# Patient Record
Sex: Female | Born: 1950
Health system: Southern US, Community
[De-identification: ages and names within clinical notes are randomized; demographics above are authoritative.]

## PROBLEM LIST (undated history)

## (undated) DIAGNOSIS — M1990 Unspecified osteoarthritis, unspecified site: Secondary | ICD-10-CM

## (undated) DIAGNOSIS — E785 Hyperlipidemia, unspecified: Secondary | ICD-10-CM

## (undated) DIAGNOSIS — T7840XA Allergy, unspecified, initial encounter: Secondary | ICD-10-CM

## (undated) HISTORY — DX: Unspecified osteoarthritis, unspecified site: M19.90

## (undated) HISTORY — DX: Hyperlipidemia, unspecified: E78.5

## (undated) HISTORY — DX: Allergy, unspecified, initial encounter: T78.40XA

---

## 1984-04-11 HISTORY — PX: TUBAL LIGATION: SHX77

## 2012-07-20 ENCOUNTER — Ambulatory Visit (INDEPENDENT_AMBULATORY_CARE_PROVIDER_SITE_OTHER): Payer: 59 | Admitting: Family Medicine

## 2012-07-20 ENCOUNTER — Encounter: Payer: Self-pay | Admitting: Family Medicine

## 2012-07-20 VITALS — BP 110/70 | HR 72 | Temp 98.5°F | Resp 14 | Wt 122.0 lb

## 2012-07-20 DIAGNOSIS — L821 Other seborrheic keratosis: Secondary | ICD-10-CM

## 2012-07-20 DIAGNOSIS — B353 Tinea pedis: Secondary | ICD-10-CM

## 2012-07-20 DIAGNOSIS — E785 Hyperlipidemia, unspecified: Secondary | ICD-10-CM | POA: Insufficient documentation

## 2012-07-20 NOTE — Progress Notes (Signed)
  Subjective:    Patient ID: Taylor Shah, female    DOB: 05-19-1950, 62 y.o.   MRN: [id number]  HPI  Patient is here for 2 rashes. She has a salmon colored to brown scaly papule on her left breast that has been there for several months. It is asymptomatic otherwise.  He also has a red scaly patch on her right second toe which worsened with cortisone cream has been there for over a month.  He has a clinical characteristics of athlete's foot. It is also next to 80 great toe that has onychomycosis.  Past Medical History  Diagnosis Date  . Hyperlipidemia     Current outpatient prescriptions:Nutritional Supplements (JUICE PLUS FIBRE) LIQD, Take by mouth., Disp: , Rfl: ;  vitamin C (ASCORBIC ACID) 500 MG tablet, Take 500 mg by mouth daily., Disp: , Rfl:   History   Social History  . Marital Status: Married    Spouse Name: N/A    Number of Children: N/A  . Years of Education: N/A   Occupational History  . Not on file.   Social History Main Topics  . Smoking status: Never Smoker   . Smokeless tobacco: Not on file  . Alcohol Use: Yes     Comment: Wine - occasional  . Drug Use: No  . Sexually Active: Not on file   Other Topics Concern  . Not on file   Social History Narrative  . No narrative on file     Review of Systems    review of systems is otherwise negative. Objective:   Physical Exam  Constitutional: She appears well-developed and well-nourished.  Cardiovascular: Normal rate, regular rhythm and normal heart sounds.   No murmur heard. Pulmonary/Chest: Effort normal and breath sounds normal.   papule on left breast is approximately 6 mm, patch on right second toe is approximately 1.5 cm in diameter.          Assessment & Plan:  Seborrheic keratoses  Tenia pedis   Reassured patient regarding spot on her left breast we will monitor clinically.  Regimen cream twice a day for 2 weeks to the spot on her foot. If it is not better she will contact me.

## 2012-08-10 ENCOUNTER — Telehealth: Payer: Self-pay | Admitting: Family Medicine

## 2012-08-10 DIAGNOSIS — Z0000 Encounter for general adult medical examination without abnormal findings: Secondary | ICD-10-CM

## 2012-08-13 NOTE — Telephone Encounter (Signed)
Labs ordered for CPE

## 2012-09-05 ENCOUNTER — Other Ambulatory Visit (INDEPENDENT_AMBULATORY_CARE_PROVIDER_SITE_OTHER): Payer: 59

## 2012-09-05 DIAGNOSIS — Z0000 Encounter for general adult medical examination without abnormal findings: Secondary | ICD-10-CM

## 2012-09-06 ENCOUNTER — Other Ambulatory Visit: Payer: 59

## 2012-09-06 LAB — CBC WITH DIFFERENTIAL/PLATELET
Basophils Absolute: 0 10*3/uL (ref 0.0–0.1)
Basophils Relative: 0 % (ref 0–1)
Eosinophils Absolute: 0.1 10*3/uL (ref 0.0–0.7)
MCH: 27.9 pg (ref 26.0–34.0)
MCHC: 32.8 g/dL (ref 30.0–36.0)
Monocytes Relative: 8 % (ref 3–12)
Neutrophils Relative %: 53 % (ref 43–77)
Platelets: 228 10*3/uL (ref 150–400)
RDW: 13.9 % (ref 11.5–15.5)

## 2012-09-06 LAB — COMPREHENSIVE METABOLIC PANEL
Alkaline Phosphatase: 69 U/L (ref 39–117)
Glucose, Bld: 81 mg/dL (ref 70–99)
Sodium: 138 mEq/L (ref 135–145)
Total Bilirubin: 0.8 mg/dL (ref 0.3–1.2)
Total Protein: 6.8 g/dL (ref 6.0–8.3)

## 2012-09-06 LAB — LIPID PANEL
LDL Cholesterol: 193 mg/dL — ABNORMAL HIGH (ref 0–99)
Triglycerides: 56 mg/dL (ref <150)
VLDL: 11 mg/dL (ref 0–40)

## 2012-09-13 ENCOUNTER — Ambulatory Visit (INDEPENDENT_AMBULATORY_CARE_PROVIDER_SITE_OTHER): Payer: 59 | Admitting: Family Medicine

## 2012-09-13 ENCOUNTER — Encounter: Payer: Self-pay | Admitting: Family Medicine

## 2012-09-13 VITALS — BP 110/68 | HR 78 | Temp 98.2°F | Resp 20 | Ht 62.0 in | Wt 115.0 lb

## 2012-09-13 DIAGNOSIS — Z1239 Encounter for other screening for malignant neoplasm of breast: Secondary | ICD-10-CM

## 2012-09-13 DIAGNOSIS — Z1211 Encounter for screening for malignant neoplasm of colon: Secondary | ICD-10-CM

## 2012-09-13 DIAGNOSIS — Z0000 Encounter for general adult medical examination without abnormal findings: Secondary | ICD-10-CM

## 2012-09-13 DIAGNOSIS — E785 Hyperlipidemia, unspecified: Secondary | ICD-10-CM

## 2012-09-13 NOTE — Progress Notes (Signed)
Subjective:    Patient ID: Taylor Shah, female    DOB: 1951-02-04, 62 y.o.   MRN: [id number]  HPI Patient is here today for complete physical exam. Her recent lab work was reviewed in detail with the patient. Is significant for an LDL cholesterol of 193. She has a significant family history of coronary artery disease with bypasses in her father and a stroke in her paternal grandmother.  The labs are listed below. No visits with results within 1 Week(s) from this visit. Latest known visit with results is:  Appointment on 09/05/2012  Component Date Value Range Status  . WBC 09/05/2012 6.1  4.0 - 10.5 K/uL Final  . RBC 09/05/2012 4.37  3.87 - 5.11 MIL/uL Final  . Hemoglobin 09/05/2012 12.2  12.0 - 15.0 g/dL Final  . HCT 40/98/1191 37.2  36.0 - 46.0 % Final  . MCV 09/05/2012 85.1  78.0 - 100.0 fL Final  . MCH 09/05/2012 27.9  26.0 - 34.0 pg Final  . MCHC 09/05/2012 32.8  30.0 - 36.0 g/dL Final  . RDW 47/82/9562 13.9  11.5 - 15.5 % Final  . Platelets 09/05/2012 228  150 - 400 K/uL Final  . Neutrophils Relative % 09/05/2012 53  43 - 77 % Final  . Neutro Abs 09/05/2012 3.2  1.7 - 7.7 K/uL Final  . Lymphocytes Relative 09/05/2012 38  12 - 46 % Final  . Lymphs Abs 09/05/2012 2.3  0.7 - 4.0 K/uL Final  . Monocytes Relative 09/05/2012 8  3 - 12 % Final  . Monocytes Absolute 09/05/2012 0.5  0.1 - 1.0 K/uL Final  . Eosinophils Relative 09/05/2012 1  0 - 5 % Final  . Eosinophils Absolute 09/05/2012 0.1  0.0 - 0.7 K/uL Final  . Basophils Relative 09/05/2012 0  0 - 1 % Final  . Basophils Absolute 09/05/2012 0.0  0.0 - 0.1 K/uL Final  . Smear Review 09/05/2012 Criteria for review not met   Final  . Sodium 09/05/2012 138  135 - 145 mEq/L Final  . Potassium 09/05/2012 4.0  3.5 - 5.3 mEq/L Final  . Chloride 09/05/2012 101  96 - 112 mEq/L Final  . CO2 09/05/2012 26  19 - 32 mEq/L Final  . Glucose, Bld 09/05/2012 81  70 - 99 mg/dL Final  . BUN 13/11/6576 11  6 - 23 mg/dL Final  . Creat 46/96/2952 0.70   0.50 - 1.10 mg/dL Final  . Total Bilirubin 09/05/2012 0.8  0.3 - 1.2 mg/dL Final  . Alkaline Phosphatase 09/05/2012 69  39 - 117 U/L Final  . AST 09/05/2012 17  0 - 37 U/L Final  . ALT 09/05/2012 12  0 - 35 U/L Final  . Total Protein 09/05/2012 6.8  6.0 - 8.3 g/dL Final  . Albumin 84/13/2440 4.4  3.5 - 5.2 g/dL Final  . Calcium 02/05/2535 9.2  8.4 - 10.5 mg/dL Final  . Cholesterol 64/40/3474 264* 0 - 200 mg/dL Final   Comment: ATP III Classification:                                < 200        mg/dL        Desirable                               200 - 239     mg/dL  Borderline High                               >= 240        mg/dL        High                             . Triglycerides 09/05/2012 56  <150 mg/dL Final  . HDL 09/81/1914 60  >39 mg/dL Final  . Total CHOL/HDL Ratio 09/05/2012 4.4   Final  . VLDL 09/05/2012 11  0 - 40 mg/dL Final  . LDL Cholesterol 09/05/2012 193* 0 - 99 mg/dL Final   Comment:                            Total Cholesterol/HDL Ratio:CHD Risk                                                 Coronary Heart Disease Risk Table                                                                 Men       Women                                   1/2 Average Risk              3.4        3.3                                       Average Risk              5.0        4.4                                    2X Average Risk              9.6        7.1                                    3X Average Risk             23.4       11.0                          Use the calculated Patient Ratio above and the CHD Risk table                           to determine the patient's CHD Risk.  ATP III Classification (LDL):                                < 100        mg/dL         Optimal                               100 - 129     mg/dL         Near or Above Optimal                               130 - 159     mg/dL         Borderline High                                160 - 189     mg/dL         High                                > 190        mg/dL         Very High                             . TSH 09/05/2012 0.569  0.350 - 4.500 uIU/mL Final   She has never had a colonoscopy. She is overdue for her mammogram. She is overdue for her Pap smear which she defers at the present time. She'll be due for a bone density a 63. She is overdue for her tetanus shot. She is also due for a single shot. Past Medical History  Diagnosis Date  . Hyperlipidemia    Outpatient Encounter Prescriptions as of 09/13/2012  Medication Sig Dispense Refill  . Nutritional Supplements (JUICE PLUS FIBRE) LIQD Take by mouth.      . psyllium (METAMUCIL) 58.6 % powder Take 1 packet by mouth 3 (three) times daily.      . vitamin C (ASCORBIC ACID) 500 MG tablet Take 500 mg by mouth daily.       No facility-administered encounter medications on file as of 09/13/2012.   No Known Allergies History   Social History  . Marital Status: Married    Spouse Name: N/A    Number of Children: N/A  . Years of Education: N/A   Occupational History  . Not on file.   Social History Main Topics  . Smoking status: Never Smoker   . Smokeless tobacco: Never Used  . Alcohol Use: Yes     Comment: Wine - occasional  . Drug Use: No  . 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   Other Topics Concern  . Not on file   Social History Narrative  . No narrative on file   Family History  Problem Relation Age of Onset  . Heart disease Father   . Hyperlipidemia Brother   . Stroke Paternal Grandmother       Review of Systems  All other systems reviewed and are negative.       Objective:   Physical Exam  Vitals reviewed. Constitutional: She is oriented to person, place, and time. She appears well-developed and well-nourished.  HENT:  Head: Normocephalic and atraumatic.  Right Ear: External ear normal.  Left Ear: External ear normal.  Nose: Nose  normal.  Mouth/Throat: No oropharyngeal exudate.  Eyes: Conjunctivae and EOM are normal. Pupils are equal, round, and reactive to light. Right eye exhibits no discharge. Left eye exhibits no discharge. No scleral icterus.  Neck: Normal range of motion. Neck supple. No JVD present. No tracheal deviation present. No thyromegaly present.  Cardiovascular: Normal rate, regular rhythm, normal heart sounds and intact distal pulses.  Exam reveals no gallop and no friction rub.   No murmur heard. Pulmonary/Chest: Effort normal and breath sounds normal. No respiratory distress. She has no wheezes. She has no rales. She exhibits no tenderness.  Abdominal: Soft. Bowel sounds are normal. She exhibits no distension and no mass. There is no tenderness. There is no rebound and no guarding.  Musculoskeletal: Normal range of motion. She exhibits no edema and no tenderness.  Lymphadenopathy:    She has no cervical adenopathy.  Neurological: She is alert and oriented to person, place, and time. She has normal reflexes. She displays normal reflexes. No cranial nerve deficit. She exhibits normal muscle tone. Coordination normal.  Skin: Skin is warm. No rash noted. No erythema. No pallor.  Psychiatric: She has a normal mood and affect. Her behavior is normal. Judgment and thought content normal.          Assessment & Plan:  1. Routine general medical examination at a health care facility Physical exam is normal. I will arrange for colonoscopy and a mammogram. I strongly recommended a Pap smear. I also recommended a single shot and a tetanus shot. The patient is to call single shot and tetanus shot and return if she wants.  Otherwise physical exam is normal aside from her hyperlipidemia. - Ambulatory referral to Gastroenterology - MM Digital Screening; Future  2. HLD (hyperlipidemia) Charlie recommended Lipitor 40 mg by mouth daily. Patient deferred for now.  3. Screening for breast cancer Schedule  mammogram - MM Digital Screening; Future  4. Screening for colon cancer Surgical colonoscopy - Ambulatory referral to Gastroenterology

## 2012-09-21 ENCOUNTER — Encounter: Payer: Self-pay | Admitting: Internal Medicine

## 2012-10-09 ENCOUNTER — Telehealth: Payer: Self-pay | Admitting: Family Medicine

## 2012-10-09 MED ORDER — ATORVASTATIN CALCIUM 40 MG PO TABS: 40.0000 mg | ORAL_TABLET | Freq: Every day | ORAL | Status: DC

## 2012-10-09 NOTE — Telephone Encounter (Signed)
Rx filled

## 2012-10-15 ENCOUNTER — Ambulatory Visit
Admission: RE | Admit: 2012-10-15 | Discharge: 2012-10-15 | Disposition: A | Discharge status: Home / Self Care | Payer: 59 | Source: Ambulatory Visit | Attending: Family Medicine | Admitting: Family Medicine

## 2012-10-15 DIAGNOSIS — Z0000 Encounter for general adult medical examination without abnormal findings: Secondary | ICD-10-CM

## 2012-10-15 DIAGNOSIS — Z1239 Encounter for other screening for malignant neoplasm of breast: Secondary | ICD-10-CM

## 2012-11-05 ENCOUNTER — Encounter: Payer: Self-pay | Admitting: Family Medicine

## 2012-11-05 ENCOUNTER — Ambulatory Visit (INDEPENDENT_AMBULATORY_CARE_PROVIDER_SITE_OTHER): Payer: 59 | Admitting: Family Medicine

## 2012-11-05 VITALS — BP 98/62 | HR 74 | Temp 98.2°F | Resp 14 | Wt 117.0 lb

## 2012-11-05 DIAGNOSIS — B353 Tinea pedis: Secondary | ICD-10-CM

## 2012-11-05 MED ORDER — FLUCONAZOLE 150 MG PO TABS: ORAL_TABLET | ORAL | Status: DC

## 2012-11-05 MED ORDER — CLOTRIMAZOLE 1 % EX CREA: TOPICAL_CREAM | Freq: Two times a day (BID) | CUTANEOUS | Status: DC

## 2012-11-05 NOTE — Progress Notes (Signed)
  Subjective:    Patient ID: Taylor Shah, female    DOB: [birth year], 62 y.o.   MRN: [id number]  HPI  Patient has had a scaly patch on the plantar aspect of her left foot for her over a month. She is tried over-the-counter Lotrimin cream, Tinactin, and even soaking her feet in peroxide.  This has not helped. It is a patch approximately 8 cm x 6 cm in diameter. It is erythematous and scaly. It is well-circumscribed. KOH performed today in the office his negative. Past Medical History  Diagnosis Date  . Hyperlipidemia    Current Outpatient Prescriptions on File Prior to Visit  Medication Sig Dispense Refill  . atorvastatin (LIPITOR) 40 MG tablet Take 1 tablet (40 mg total) by mouth at bedtime.  30 tablet  3  . Nutritional Supplements (JUICE PLUS FIBRE) LIQD Take by mouth.      . psyllium (METAMUCIL) 58.6 % powder Take 1 packet by mouth 3 (three) times daily.      . vitamin C (ASCORBIC ACID) 500 MG tablet Take 500 mg by mouth daily.       No current facility-administered medications on file prior to visit.   No Known Allergies History   Social History  . Marital Status: Married    Spouse Name: N/A    Number of Children: N/A  . Years of Education: N/A   Occupational History  . Not on file.   Social History Main Topics  . Smoking status: Never Smoker   . Smokeless tobacco: Never Used  . Alcohol Use: Yes     Comment: Wine - occasional  . Drug Use: No  . 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   Other Topics Concern  . Not on file   Social History Narrative  . No narrative on file      Review of Systems  All other systems reviewed and are negative.       Objective:   Physical Exam  Vitals reviewed. Constitutional: She appears well-developed and well-nourished.  Cardiovascular: Normal rate and regular rhythm.   Pulmonary/Chest: Effort normal and breath sounds normal.  Abdominal: Soft. Bowel sounds are normal.  Skin: Rash noted.   8  cm x 6 cm well-circumscribed scaly erythematous patch on the plantar aspect of the left foot        Assessment & Plan:  1. Tinea pedis I believe the KOH is negative because the patient has tried treating it with topical therapy.  Clinically it appears to be tenia pedis. Therefore I recommended Diflucan 150 mg by mouth 4 times a day for 2 weeks followed by Lotrimin cream applied twice a day for an additional 2 weeks.  Recheck in 2 weeks if no better. Recommended the patient discontinue Lipitor while she is taking the Diflucan. - fluconazole (DIFLUCAN) 150 MG tablet; 1 pill every other day for 2 weeks  Dispense: 7 tablet; Refill: 0 - clotrimazole (LOTRIMIN) 1 % cream; Apply topically 2 (two) times daily.  Dispense: 30 g; Refill: 0

## 2012-11-07 ENCOUNTER — Ambulatory Visit (AMBULATORY_SURGERY_CENTER): Payer: 59 | Admitting: *Deleted

## 2012-11-07 VITALS — Ht 62.0 in | Wt 117.2 lb

## 2012-11-07 DIAGNOSIS — Z1211 Encounter for screening for malignant neoplasm of colon: Secondary | ICD-10-CM

## 2012-11-07 MED ORDER — NA SULFATE-K SULFATE-MG SULF 17.5-3.13-1.6 GM/177ML PO SOLN: 1.0000 | Freq: Once | ORAL | Status: DC

## 2012-11-07 NOTE — Progress Notes (Signed)
No egg or soy allergy. No anesthesia problems.  

## 2012-11-08 ENCOUNTER — Encounter: Payer: Self-pay | Admitting: Internal Medicine

## 2012-11-22 ENCOUNTER — Encounter: Payer: Self-pay | Admitting: Internal Medicine

## 2012-11-22 ENCOUNTER — Ambulatory Visit (AMBULATORY_SURGERY_CENTER): Payer: 59 | Admitting: Internal Medicine

## 2012-11-22 VITALS — BP 125/55 | HR 69 | Temp 98.4°F | Resp 16 | Ht 62.0 in | Wt 117.0 lb

## 2012-11-22 DIAGNOSIS — Z1211 Encounter for screening for malignant neoplasm of colon: Secondary | ICD-10-CM

## 2012-11-22 MED ORDER — SODIUM CHLORIDE 0.9 % IV SOLN: 500.0000 mL | INTRAVENOUS | Status: DC

## 2012-11-22 NOTE — Progress Notes (Signed)
A/ox3 pleased with MAC, report to Karen RN 

## 2012-11-22 NOTE — Patient Instructions (Addendum)
The colonoscopy was normal with a great prep.  Next routine colonoscopy in 10 years 2024.  I appreciate the opportunity to care for you. Iva Boop, MD, FACG YOU HAD AN ENDOSCOPIC PROCEDURE TODAY AT THE [facility]: Refer to the procedure report that was given to you for any specific questions about what was found during the examination.  If the procedure report does not answer your questions, please call your gastroenterologist to clarify.  If you requested that your care partner not be given the details of your procedure findings, then the procedure report has been included in a sealed envelope for you to review at your convenience later.  YOU SHOULD EXPECT: Some feelings of bloating in the abdomen. Passage of more gas than usual.  Walking can help get rid of the air that was put into your GI tract during the procedure and reduce the bloating. If you had a lower endoscopy (such as a colonoscopy or flexible sigmoidoscopy) you may notice spotting of blood in your stool or on the toilet paper. If you underwent a bowel prep for your procedure, then you may not have a normal bowel movement for a few days.  DIET: Your first meal following the procedure should be a light meal and then it is ok to progress to your normal diet.  A half-sandwich or bowl of soup is an example of a good first meal.  Heavy or fried foods are harder to digest and may make you feel nauseous or bloated.  Likewise meals heavy in dairy and vegetables can cause extra gas to form and this can also increase the bloating.  Drink plenty of fluids but you should avoid alcoholic beverages for 24 hours.  ACTIVITY: Your care partner should take you home directly after the procedure.  You should plan to take it easy, moving slowly for the rest of the day.  You can resume normal activity the day after the procedure however you should NOT DRIVE or use heavy machinery for 24 hours (because of the sedation medicines used during the  test).    SYMPTOMS TO REPORT IMMEDIATELY: A gastroenterologist can be reached at any hour.  During normal business hours, 8:30 AM to 5:00 PM Monday through Friday, call [phone].  After hours and on weekends, please call the GI answering service at [phone] who will take a message and have the physician on call contact you.   Following lower endoscopy (colonoscopy or flexible sigmoidoscopy):  Excessive amounts of blood in the stool  Significant tenderness or worsening of abdominal pains  Swelling of the abdomen that is new, acute  Fever of 100F or higher  FOLLOW UP: If any biopsies were taken you will be contacted by phone or by letter within the next 1-3 weeks.  Call your gastroenterologist if you have not heard about the biopsies in 3 weeks.  Our staff will call the home number listed on your records the next business day following your procedure to check on you and address any questions or concerns that you may have at that time regarding the information given to you following your procedure. This is a courtesy call and so if there is no answer at the home number and we have not heard from you through the emergency physician on call, we will assume that you have returned to your regular daily activities without incident.  SIGNATURES/CONFIDENTIALITY: You and/or your care partner have signed paperwork which will be entered into your electronic medical record.  These signatures attest to the fact that that the information above on your After Visit Summary has been reviewed and is understood.  Full responsibility of the confidentiality of this discharge information lies with you and/or your care-partner.

## 2012-11-22 NOTE — Op Note (Signed)
[facility] [address] [place], [address]   COLONOSCOPY PROCEDURE REPORT  PATIENT: Taylor Shah, Taylor Shah  MR#: [id number] BIRTHDATE: 1950-05-06 , 62  yrs. old GENDER: Female ENDOSCOPIST: Iva Boop, MD, [facility] REFERRED HQ:[id number] Tanya Nones, M.D. PROCEDURE DATE:  11/22/2012 PROCEDURE:   Colonoscopy, screening First Screening Colonoscopy - Avg.  risk and is 50 yrs.  old or older Yes.  Prior Negative Screening - Now for repeat screening. N/A  History of Adenoma - Now for follow-up colonoscopy & has been > or = to 3 yrs.  N/A  Polyps Removed Today? No.  Recommend repeat exam, <10 yrs? No. ASA CLASS:   Class II INDICATIONS:average risk screening. MEDICATIONS: propofol (Diprivan) 200mg  IV, MAC sedation, administered by CRNA, and These medications were titrated to patient response per physician's verbal order  DESCRIPTION OF PROCEDURE:   After the risks benefits and alternatives of the procedure were thoroughly explained, informed consent was obtained.  A digital rectal exam revealed no abnormalities of the rectum.   The LB [id number]  endoscope was introduced through the anus and advanced to the cecum, which was identified by both the appendix and ileocecal valve. No adverse events experienced.   The quality of the prep was excellent using Suprep  The instrument was then slowly withdrawn as the colon was fully examined.      COLON FINDINGS: A normal appearing cecum, ileocecal valve, and appendiceal orifice were identified.  The ascending, hepatic flexure, transverse, splenic flexure, descending, sigmoid colon and rectum appeared unremarkable.  No polyps or cancers were seen.   A right colon retroflexion was performed.  Retroflexed views revealed no abnormalities. The time to cecum=4 minutes 03 seconds. Withdrawal time=9 minutes 11 seconds.  The scope was withdrawn and the procedure completed. COMPLICATIONS: There were no complications.  ENDOSCOPIC  IMPRESSION: Normal colonoscopy  RECOMMENDATIONS: Repeat routine colonscopy in 10 years.   eSigned:  Iva Boop, MD, [facility] 11/22/2012 10:37 AM   cc: Lynnea Ferrier, MD and The Patient

## 2012-11-22 NOTE — Progress Notes (Signed)
Patient did not experience any of the following events: a burn prior to discharge; a fall within the facility; wrong site/side/patient/procedure/implant event; or a hospital transfer or hospital admission upon discharge from the facility. (G8907) Patient did not have preoperative order for IV antibiotic SSI prophylaxis. (G8918)  

## 2012-11-23 ENCOUNTER — Telehealth: Payer: Self-pay

## 2012-11-23 NOTE — Telephone Encounter (Signed)
  Follow up Call-  Call back number 11/22/2012  Post procedure Call Back phone  # [phone]  Permission to leave phone message Yes     Patient questions:  Do you have a fever, pain , or abdominal swelling? no Pain Score  0 *  Have you tolerated food without any problems? yes  Have you been able to return to your normal activities? yes  Do you have any questions about your discharge instructions: Diet   no Medications  no Follow up visit  no  Do you have questions or concerns about your Care? no  Actions: * If pain score is 4 or above: No action needed, pain <4.

## 2013-02-14 ENCOUNTER — Other Ambulatory Visit: Payer: Self-pay

## 2013-07-25 ENCOUNTER — Telehealth: Payer: Self-pay | Admitting: Family Medicine

## 2013-07-25 MED ORDER — NITROFURANTOIN MACROCRYSTAL 50 MG PO CAPS: 50.0000 mg | ORAL_CAPSULE | ORAL | Status: DC | PRN

## 2013-07-25 NOTE — Telephone Encounter (Signed)
Pt is wanting a refill on Macrobid 50mg  to take after sexual activity, she states that you had given her an rx for this a while back and she only takes it occasionally to avoid UTI's. ? OK to Refill

## 2013-07-25 NOTE — Telephone Encounter (Signed)
Med sent to pharm / pt aware   

## 2013-07-25 NOTE — Telephone Encounter (Signed)
Ok with 10 tabs.

## 2013-09-30 ENCOUNTER — Telehealth: Payer: Self-pay | Admitting: Family Medicine

## 2013-09-30 NOTE — Telephone Encounter (Signed)
On what medication?

## 2013-09-30 NOTE — Telephone Encounter (Signed)
Patient is calling to get refill on [phone] [facility]

## 2013-10-01 ENCOUNTER — Other Ambulatory Visit: Payer: Self-pay | Admitting: Family Medicine

## 2013-10-01 NOTE — Telephone Encounter (Signed)
Patient states she is using as prophylactic.   Ok to refill??  Last office visit 11/05/2012.  Last refill 07/25/2013.

## 2013-10-01 NOTE — Telephone Encounter (Signed)
ok 

## 2013-10-02 NOTE — Telephone Encounter (Signed)
Prescription sent to pharmacy.

## 2013-10-24 ENCOUNTER — Other Ambulatory Visit: Payer: 59

## 2013-10-24 ENCOUNTER — Other Ambulatory Visit: Payer: Self-pay | Admitting: Family Medicine

## 2013-10-24 DIAGNOSIS — Z0000 Encounter for general adult medical examination without abnormal findings: Secondary | ICD-10-CM

## 2013-10-24 DIAGNOSIS — Z79899 Other long term (current) drug therapy: Secondary | ICD-10-CM

## 2013-10-24 DIAGNOSIS — E785 Hyperlipidemia, unspecified: Secondary | ICD-10-CM

## 2013-10-24 LAB — LIPID PANEL
CHOLESTEROL: 303 mg/dL — AB (ref 0–200)
HDL: 74 mg/dL (ref >39)
LDL Cholesterol: 218 mg/dL — ABNORMAL HIGH (ref 0–99)
TRIGLYCERIDES: 55 mg/dL (ref <150)
Total CHOL/HDL Ratio: 4.1 Ratio
VLDL: 11 mg/dL (ref 0–40)

## 2013-10-24 LAB — COMPLETE METABOLIC PANEL WITH GFR
ALBUMIN: 4.3 g/dL (ref 3.5–5.2)
ALK PHOS: 63 U/L (ref 39–117)
ALT: 15 U/L (ref 0–35)
AST: 18 U/L (ref 0–37)
BUN: 13 mg/dL (ref 6–23)
CO2: 28 meq/L (ref 19–32)
Calcium: 9.2 mg/dL (ref 8.4–10.5)
Chloride: 102 mEq/L (ref 96–112)
Creat: 0.69 mg/dL (ref 0.50–1.10)
GFR, Est Non African American: >89 mL/min
GLUCOSE: 76 mg/dL (ref 70–99)
POTASSIUM: 4.3 meq/L (ref 3.5–5.3)
SODIUM: 138 meq/L (ref 135–145)
TOTAL PROTEIN: 6.6 g/dL (ref 6.0–8.3)
Total Bilirubin: 0.7 mg/dL (ref 0.2–1.2)

## 2013-10-24 LAB — CBC WITH DIFFERENTIAL/PLATELET
BASOS PCT: 1 % (ref 0–1)
Basophils Absolute: 0.1 10*3/uL (ref 0.0–0.1)
EOS ABS: 0.1 10*3/uL (ref 0.0–0.7)
EOS PCT: 2 % (ref 0–5)
HCT: 37.5 % (ref 36.0–46.0)
HEMOGLOBIN: 12.9 g/dL (ref 12.0–15.0)
Lymphocytes Relative: 30 % (ref 12–46)
Lymphs Abs: 1.7 10*3/uL (ref 0.7–4.0)
MCH: 28.7 pg (ref 26.0–34.0)
MCHC: 34.4 g/dL (ref 30.0–36.0)
MCV: 83.5 fL (ref 78.0–100.0)
MONO ABS: 0.6 10*3/uL (ref 0.1–1.0)
MONOS PCT: 10 % (ref 3–12)
NEUTROS PCT: 57 % (ref 43–77)
Neutro Abs: 3.3 10*3/uL (ref 1.7–7.7)
Platelets: 223 10*3/uL (ref 150–400)
RBC: 4.49 MIL/uL (ref 3.87–5.11)
RDW: 14.1 % (ref 11.5–15.5)
WBC: 5.8 10*3/uL (ref 4.0–10.5)

## 2013-10-24 LAB — C-REACTIVE PROTEIN: CRP: <0.5 mg/dL (ref <0.60)

## 2013-10-24 LAB — TSH: TSH: 0.645 u[IU]/mL (ref 0.350–4.500)

## 2013-10-31 ENCOUNTER — Encounter: Payer: Self-pay | Admitting: Family Medicine

## 2013-10-31 ENCOUNTER — Ambulatory Visit (INDEPENDENT_AMBULATORY_CARE_PROVIDER_SITE_OTHER): Payer: 59 | Admitting: Family Medicine

## 2013-10-31 VITALS — BP 108/74 | HR 68 | Temp 98.7°F | Resp 16 | Ht 62.0 in | Wt 117.0 lb

## 2013-10-31 DIAGNOSIS — Z0000 Encounter for general adult medical examination without abnormal findings: Secondary | ICD-10-CM

## 2013-10-31 DIAGNOSIS — Z13820 Encounter for screening for osteoporosis: Secondary | ICD-10-CM

## 2013-10-31 MED ORDER — ATORVASTATIN CALCIUM 40 MG PO TABS: 40.0000 mg | ORAL_TABLET | Freq: Every day | ORAL | Status: DC

## 2013-10-31 MED ORDER — NITROFURANTOIN MACROCRYSTAL 50 MG PO CAPS: ORAL_CAPSULE | ORAL | Status: DC

## 2013-10-31 MED ORDER — ZOSTER VACCINE LIVE 19400 UNT/0.65ML ~~LOC~~ SOLR: 0.6500 mL | Freq: Once | SUBCUTANEOUS | Status: DC

## 2013-11-01 ENCOUNTER — Encounter: Payer: Self-pay | Admitting: Family Medicine

## 2013-11-01 NOTE — Progress Notes (Signed)
Subjective:    Patient ID: Taylor Shah, female    DOB: 1950/11/12, 63 y.o.   MRN: [id number]  HPI Patient is here for complete physical exam. Her only medical concern is she would like a refill on nitrofurantoin 50 mg. She takes one tablet after sexual intercourse to prevent urinary tract infections. She is aware of possible bacterial resistance from her PD use. She uses approximately 5 tablets per month. Otherwise she is doing well with no complaints. Her colonoscopy was performed last year and was normal. She is overdue for mammogram. Her last mammogram was in July of 2014. She is overdue for a Pap smear. Her last Pap smear was 4 years ago. She has no history of an abnormal Pap smear. She is also due for Zostavax, as well as Pneumovax 23. Her most recent lab work is listed below: Orders Only on 10/24/2013  Component Date Value Ref Range Status  . CRP 10/24/2013 <0.5  <0.60 mg/dL Final  Lab on 10/24/2013  Component Date Value Ref Range Status  . Cholesterol 10/24/2013 303* 0 - 200 mg/dL Final   Comment: ATP III Classification:                                < 200        mg/dL        Desirable                               200 - 239     mg/dL        Borderline High                               >= 240        mg/dL        High                             . Triglycerides 10/24/2013 55  <150 mg/dL Final  . HDL 10/24/2013 74  >39 mg/dL Final  . Total CHOL/HDL Ratio 10/24/2013 4.1   Final  . VLDL 10/24/2013 11  0 - 40 mg/dL Final  . LDL Cholesterol 10/24/2013 218* 0 - 99 mg/dL Final   Comment:                            Total Cholesterol/HDL Ratio:CHD Risk                                                 Coronary Heart Disease Risk Table                                                                 Men       Women  1/2 Average Risk              3.4        3.3                                       Average Risk              5.0        4.4              2X Average Risk              9.6        7.1                                    3X Average Risk             23.4       11.0                          Use the calculated Patient Ratio above and the CHD Risk table                           to determine the patient's CHD Risk.                          ATP III Classification (LDL):                                < 100        mg/dL         Optimal                               100 - 129     mg/dL         Near or Above Optimal                               130 - 159     mg/dL         Borderline High                               160 - 189     mg/dL         High                                > 190        mg/dL         Very High                             . WBC 10/24/2013 5.8  4.0 - 10.5 K/uL Final  . RBC 10/24/2013 4.49  3.87 - 5.11 MIL/uL Final  . Hemoglobin 10/24/2013 12.9  12.0 - 15.0 g/dL Final  . HCT 10/24/2013 37.5  36.0 - 46.0 % Final  . MCV  10/24/2013 83.5  78.0 - 100.0 fL Final  . MCH 10/24/2013 28.7  26.0 - 34.0 pg Final  . MCHC 10/24/2013 34.4  30.0 - 36.0 g/dL Final  . RDW 10/24/2013 14.1  11.5 - 15.5 % Final  . Platelets 10/24/2013 223  150 - 400 K/uL Final  . Neutrophils Relative % 10/24/2013 57  43 - 77 % Final  . Neutro Abs 10/24/2013 3.3  1.7 - 7.7 K/uL Final  . Lymphocytes Relative 10/24/2013 30  12 - 46 % Final  . Lymphs Abs 10/24/2013 1.7  0.7 - 4.0 K/uL Final  . Monocytes Relative 10/24/2013 10  3 - 12 % Final  . Monocytes Absolute 10/24/2013 0.6  0.1 - 1.0 K/uL Final  . Eosinophils Relative 10/24/2013 2  0 - 5 % Final  . Eosinophils Absolute 10/24/2013 0.1  0.0 - 0.7 K/uL Final  . Basophils Relative 10/24/2013 1  0 - 1 % Final  . Basophils Absolute 10/24/2013 0.1  0.0 - 0.1 K/uL Final  . Smear Review 10/24/2013 Criteria for review not met   Final  . TSH 10/24/2013 0.645  0.350 - 4.500 uIU/mL Final  . Sodium 10/24/2013 138  135 - 145 mEq/L Final  . Potassium 10/24/2013 4.3  3.5 - 5.3 mEq/L Final  . Chloride  10/24/2013 102  96 - 112 mEq/L Final  . CO2 10/24/2013 28  19 - 32 mEq/L Final  . Glucose, Bld 10/24/2013 76  70 - 99 mg/dL Final  . BUN 10/24/2013 13  6 - 23 mg/dL Final  . Creat 10/24/2013 0.69  0.50 - 1.10 mg/dL Final  . Total Bilirubin 10/24/2013 0.7  0.2 - 1.2 mg/dL Final  . Alkaline Phosphatase 10/24/2013 63  39 - 117 U/L Final  . AST 10/24/2013 18  0 - 37 U/L Final  . ALT 10/24/2013 15  0 - 35 U/L Final  . Total Protein 10/24/2013 6.6  6.0 - 8.3 g/dL Final  . Albumin 10/24/2013 4.3  3.5 - 5.2 g/dL Final  . Calcium 10/24/2013 9.2  8.4 - 10.5 mg/dL Final  . GFR, Est African American 10/24/2013 >89   Final  . GFR, Est Non African American 10/24/2013 >89   Final   Comment:                            The estimated GFR is a calculation valid for adults (>=68 years old)                          that uses the CKD-EPI algorithm to adjust for age and sex. It is                            not to be used for children, pregnant women, hospitalized patients,                             patients on dialysis, or with rapidly changing kidney function.                          According to the NKDEP, eGFR >89 is normal, 60-89 shows mild                          impairment, 30-59 shows moderate impairment, 15-29  shows severe                          impairment and <15 is ESRD.                              Past Medical History  Diagnosis Date  . Hyperlipidemia   . Allergy     seasonal  . Arthritis    Past Surgical History  Procedure Laterality Date  . Tubal ligation  1986   Current Outpatient Prescriptions on File Prior to Visit  Medication Sig Dispense Refill  . Calcium Carb-Cholecalciferol (CALCIUM 1000 + D) 1000-800 MG-UNIT TABS Take by mouth 2 (two) times daily.       . Nutritional Supplements (JUICE PLUS FIBRE) LIQD Take by mouth.      . vitamin C (ASCORBIC ACID) 500 MG tablet Take 500 mg by mouth daily.       No current facility-administered medications on file prior to visit.    Allergies  Allergen Reactions  . Shellfish Allergy Hives   History   Social History  . Marital Status: Married    Spouse Name: N/A    Number of Children: N/A  . Years of Education: N/A   Occupational History  . Not on file.   Social History Main Topics  . Smoking status: Never Smoker   . Smokeless tobacco: Never Used  . Alcohol Use: Yes     Comment: Wine - occasional  . Drug Use: No  . 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   Other Topics Concern  . Not on file   Social History Narrative  . No narrative on file   Family History  Problem Relation Age of Onset  . Heart disease Father   . Hyperlipidemia Brother   . Stroke Paternal Grandmother   . Colon cancer Paternal Grandfather 103      Review of Systems  All other systems reviewed and are negative.      Objective:   Physical Exam  Vitals reviewed. Constitutional: She is oriented to person, place, and time. She appears well-developed and well-nourished. No distress.  HENT:  Head: Normocephalic and atraumatic.  Right Ear: External ear normal.  Left Ear: External ear normal.  Nose: Nose normal.  Mouth/Throat: Oropharynx is clear and moist. No oropharyngeal exudate.  Eyes: Conjunctivae and EOM are normal. Pupils are equal, round, and reactive to light. Right eye exhibits no discharge. Left eye exhibits no discharge. No scleral icterus.  Neck: Normal range of motion. Neck supple. No JVD present. No tracheal deviation present. No thyromegaly present.  Cardiovascular: Normal rate, regular rhythm, normal heart sounds and intact distal pulses.  Exam reveals no gallop and no friction rub.   No murmur heard. Pulmonary/Chest: Effort normal and breath sounds normal. No stridor. No respiratory distress. She has no wheezes. She has no rales. She exhibits no tenderness.  Abdominal: Soft. Bowel sounds are normal. She exhibits no distension and no mass. There is no tenderness. There is  no rebound and no guarding.  Musculoskeletal: Normal range of motion. She exhibits no edema and no tenderness.  Lymphadenopathy:    She has no cervical adenopathy.  Neurological: She is alert and oriented to person, place, and time. She has normal reflexes. She displays normal reflexes. No cranial nerve deficit. She exhibits normal muscle tone. Coordination normal.  Skin: Skin is  warm. No rash noted. She is not diaphoretic. No erythema. No pallor.  Psychiatric: She has a normal mood and affect. Her behavior is normal. Judgment and thought content normal.          Assessment & Plan:  1. Routine general medical examination at a health care facility I attend church with this patient, and she would feel more comfortable with a female provider performing her Pap smear. I recommended she schedule an appointment with Dr. Buelah Manis at her convenience for her Pap smear.  She wants to defer her mammogram until next year. She is comfortable with mammograms every 2 years. Her colonoscopy is up to date. She is due for bone density. She declines Pneumovax today although she will consider it. She also declines treatment for her significant hyperlipidemia. Her LDL cholesterol is extremely high. I recommended atorvastatin 40 mg by mouth daily. I did send a prescription to her pharmacy. Patient will consider but at the present time she is recalcitrant to taking any medication. I did refill her nitrofurantoin.  Also sent a prescription to her pharmacy for zostavax.  2. Screening for osteoporosis - DG Bone Density; Future

## 2013-11-21 ENCOUNTER — Encounter: Payer: Self-pay | Admitting: Family Medicine

## 2013-12-05 ENCOUNTER — Other Ambulatory Visit: Payer: Medicare Other | Admitting: Family Medicine

## 2013-12-11 ENCOUNTER — Ambulatory Visit (INDEPENDENT_AMBULATORY_CARE_PROVIDER_SITE_OTHER): Payer: 59 | Admitting: Physician Assistant

## 2013-12-11 ENCOUNTER — Encounter: Payer: Self-pay | Admitting: Physician Assistant

## 2013-12-11 VITALS — BP 100/64 | HR 64 | Temp 98.7°F | Resp 16 | Ht 62.0 in | Wt 117.0 lb

## 2013-12-11 DIAGNOSIS — Z1239 Encounter for other screening for malignant neoplasm of breast: Secondary | ICD-10-CM

## 2013-12-11 DIAGNOSIS — Z01419 Encounter for gynecological examination (general) (routine) without abnormal findings: Secondary | ICD-10-CM

## 2013-12-11 DIAGNOSIS — Z23 Encounter for immunization: Secondary | ICD-10-CM

## 2013-12-11 DIAGNOSIS — Z124 Encounter for screening for malignant neoplasm of cervix: Secondary | ICD-10-CM

## 2013-12-11 NOTE — Addendum Note (Signed)
Addended by: Shary Decamp B on: 12/11/2013 04:36 PM   Modules accepted: Orders

## 2013-12-11 NOTE — Progress Notes (Signed)
Patient ID: Brea Coleson MRN: [id number], DOB: 03/24/1951, 63 y.o. Date of Encounter: @DATE @  Chief Complaint:  Chief Complaint  Patient presents with  . Gynecologic Exam    Wants Tdap and PNA 23  . Problem with eye    HPI: 63 y.o. year old white female  presents for the above.  She recently had a complete physical exam with Dr. Dennard Schaumann. He covered all parts of the complete physical exam except deferred GYN exam and had her schedule to come back for GYN exam with a female provider.  The only complaint she has today is that she is feeling an irritation in her left eye when she moves eye in certain positions. She says that she had this occur one time in the past and it was being caused by an eyelash growing at a funny direction. She says that with that episode her provider just used some tweezers and removed the eyelash. She is wanting me to look at her eyes and see if that is the same problem occurring today.   Past Medical History  Diagnosis Date  . Hyperlipidemia   . Allergy     seasonal  . Arthritis      Home Meds: Outpatient Prescriptions Prior to Visit  Medication Sig Dispense Refill  . Calcium Carb-Cholecalciferol (CALCIUM 1000 + D) 1000-800 MG-UNIT TABS Take by mouth 2 (two) times daily.       . nitrofurantoin (MACRODANTIN) 50 MG capsule TAKE ONE CAPSULE BY MOUTH AS NEEDED  20 capsule  11  . Nutritional Supplements (JUICE PLUS FIBRE) LIQD Take by mouth.      . vitamin C (ASCORBIC ACID) 500 MG tablet Take 500 mg by mouth daily.      Marland Kitchen atorvastatin (LIPITOR) 40 MG tablet Take 1 tablet (40 mg total) by mouth at bedtime.  30 tablet  11  . zoster vaccine live, PF, (ZOSTAVAX) 00174 UNT/0.65ML injection Inject 19,400 Units into the skin once.  1 each  0   No facility-administered medications prior to visit.    Allergies:  Allergies  Allergen Reactions  . Shellfish Allergy Hives    History   Social History  . Marital Status: Married    Spouse Name: N/A    Number of  Children: N/A  . Years of Education: N/A   Occupational History  . Not on file.   Social History Main Topics  . Smoking status: Never Smoker   . Smokeless tobacco: Never Used  . Alcohol Use: Yes     Comment: Wine - occasional  . Drug Use: No  . 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   Other Topics Concern  . Not on file   Social History Narrative  . No narrative on file    Family History  Problem Relation Age of Onset  . Heart disease Father   . Hyperlipidemia Brother   . Stroke Paternal Grandmother   . Colon cancer Paternal Grandfather 39     Review of Systems:  See HPI for pertinent ROS. All other ROS negative.    Physical Exam: Blood pressure 100/64, pulse 64, temperature 98.7 F (37.1 C), temperature source Oral, resp. rate 16, height 5\' 2"  (1.575 m), weight 117 lb (53.071 kg)., Body mass index is 21.39 kg/(m^2). General: WNWD WF. Appears in no acute distress. Head: Left Eye: Lower eye lid, medial aspect: There is one eyelash at the very medial aspect that is growing sideways at an angle,  toward medial canthus. Neck: Supple. No thyromegaly. No lymphadenopathy. Lungs: Clear bilaterally to auscultation without wheezes, rales, or rhonchi. Breathing is unlabored. Heart: RRR with S1 S2. No murmurs, rubs, or gallops. Breasts: Inspection is normal with breasts symmetrical. No erythema and no skin changes. Palpation is normal with no masses and no nipple discharge. Pelvic exam: External genitalia normal. Vaginal mucosa normal. Cervix normal. On manual exam is normal. No cervical motion tenderness. No adnexal mass. No uterine mass. Musculoskeletal:  Strength and tone normal for age. Extremities/Skin: Warm and dry.  Neuro: Alert and oriented X 3. Moves all extremities spontaneously. Gait is normal. CNII-XII grossly in tact. Psych:  Responds to questions appropriately with a normal affect.     ASSESSMENT AND PLAN:  63 y.o. year old female  with  1. Encounter for screening breast examination  2. Encounter for cervical Pap smear with pelvic exam - PAP, Thin Prep w/HPV rflx HPV Type 16/18  3. Abnormal Growing Eyelash--removed with forceps.  [address] [place], [place], [facility] 12/11/2013 3:35 PM

## 2013-12-13 ENCOUNTER — Encounter: Payer: Self-pay | Admitting: Family Medicine

## 2013-12-13 LAB — PAP, THIN PREP W/HPV RFLX HPV TYPE 16/18: HPV DNA High Risk: NOT DETECTED

## 2014-06-10 NOTE — Telephone Encounter (Signed)
Patient was seen in July.

## 2015-07-09 ENCOUNTER — Ambulatory Visit (INDEPENDENT_AMBULATORY_CARE_PROVIDER_SITE_OTHER): Payer: PPO | Admitting: Family Medicine

## 2015-07-09 ENCOUNTER — Encounter: Payer: Self-pay | Admitting: Family Medicine

## 2015-07-09 VITALS — BP 110/68 | HR 78 | Temp 99.1°F | Resp 16 | Ht 62.0 in | Wt 123.0 lb

## 2015-07-09 DIAGNOSIS — R509 Fever, unspecified: Secondary | ICD-10-CM | POA: Diagnosis not present

## 2015-07-09 DIAGNOSIS — R6883 Chills (without fever): Secondary | ICD-10-CM | POA: Diagnosis not present

## 2015-07-09 LAB — URINALYSIS, ROUTINE W REFLEX MICROSCOPIC
Bilirubin Urine: NEGATIVE
Glucose, UA: NEGATIVE
KETONES UR: NEGATIVE
Leukocytes, UA: NEGATIVE
NITRITE: NEGATIVE
PROTEIN: NEGATIVE
SPECIFIC GRAVITY, URINE: 1.01 (ref 1.001–1.035)
pH: 7 (ref 5.0–8.0)

## 2015-07-09 LAB — URINALYSIS, MICROSCOPIC ONLY
CRYSTALS: NONE SEEN [HPF]
Casts: NONE SEEN [LPF]
Squamous Epithelial / LPF: NONE SEEN [HPF] (ref <=5)
WBC UA: NONE SEEN WBC/HPF (ref <=5)
Yeast: NONE SEEN [HPF]

## 2015-07-09 MED ORDER — AMOXICILLIN 875 MG PO TABS: 875.0000 mg | ORAL_TABLET | Freq: Two times a day (BID) | ORAL | Status: DC

## 2015-07-09 NOTE — Progress Notes (Signed)
Subjective:    Patient ID: Taylor Shah, female    DOB: January 11, 1951, 65 y.o.   MRN: [id number]  HPI  His had flulike symptoms for one week. Symptoms include a low-grade fever. The highest fever the patient has seen has been 100.6, a dull constant headache. The headache is located primarily behind the eyes and in the sinus area. She has a cough productive of yellow and green mucus. She has sinus congestion and sinus pressure. She has postnasal drip and sinus drainage. She also reports body aches, low back pain. She does have a history of frequent urinary tract infections Past Medical History  Diagnosis Date  . Hyperlipidemia   . Allergy     seasonal  . Arthritis    Past Surgical History  Procedure Laterality Date  . Tubal ligation  1986   Current Outpatient Prescriptions on File Prior to Visit  Medication Sig Dispense Refill  . Calcium Carb-Cholecalciferol (CALCIUM 1000 + D) 1000-800 MG-UNIT TABS Take by mouth 2 (two) times daily.     . nitrofurantoin (MACRODANTIN) 50 MG capsule TAKE ONE CAPSULE BY MOUTH AS NEEDED 20 capsule 11  . Nutritional Supplements (JUICE PLUS FIBRE) LIQD Take by mouth.    . vitamin C (ASCORBIC ACID) 500 MG tablet Take 500 mg by mouth daily.     No current facility-administered medications on file prior to visit.   Allergies  Allergen Reactions  . Shellfish Allergy Hives   Social History   Social History  . Marital Status: Married    Spouse Name: N/A  . Number of Children: N/A  . Years of Education: N/A   Occupational History  . Not on file.   Social History Main Topics  . Smoking status: Never Smoker   . Smokeless tobacco: Never Used  . Alcohol Use: Yes     Comment: Wine - occasional  . Drug Use: No  . 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   Other Topics Concern  . Not on file   Social History Narrative    Review of Systems  All other systems reviewed and are negative.      Objective:   Physical  Exam  Constitutional: She appears well-developed and well-nourished. No distress.  HENT:  Right Ear: Tympanic membrane, external ear and ear canal normal.  Left Ear: Tympanic membrane, external ear and ear canal normal.  Nose: Mucosal edema and rhinorrhea present. Right sinus exhibits frontal sinus tenderness. Left sinus exhibits frontal sinus tenderness.  Mouth/Throat: Oropharynx is clear and moist. No oropharyngeal exudate.  Eyes: Conjunctivae are normal.  Neck: Neck supple.  Cardiovascular: Normal rate, regular rhythm and normal heart sounds.   Pulmonary/Chest: Effort normal and breath sounds normal. No respiratory distress. She has no wheezes. She has no rales.  Lymphadenopathy:    She has no cervical adenopathy.  Skin: She is not diaphoretic.  Vitals reviewed.         Assessment & Plan:  Chills - Plan: Urinalysis, Routine w reflex microscopic (not at [facility])  Fever, unspecified - Plan: Urinalysis, Routine w reflex microscopic (not at [facility])  I believe the patient is primarily suffering with symptoms residual to the flu. I explained to the patient for influenza can typically last 7-10 days and therefore her symptoms are still within keeping with a normal duration of symptoms. The other possibility is that the patient is starting to develop a secondary sinus infection. Therefore I will give the patient a prescription  for amoxicillin 875 mg by mouth twice a day for 10 days. I will ask her not to fill the prescription. I last her to give me another 48 hours. She is symptoms worsen or not show any improvement at that time, I would like her to start the antibiotic

## 2015-07-10 ENCOUNTER — Encounter: Payer: Self-pay | Admitting: Family Medicine

## 2015-08-28 ENCOUNTER — Encounter: Payer: Self-pay | Admitting: Family Medicine

## 2015-08-31 ENCOUNTER — Other Ambulatory Visit: Payer: Self-pay | Admitting: Family Medicine

## 2015-08-31 ENCOUNTER — Encounter: Payer: Self-pay | Admitting: Family Medicine

## 2015-08-31 MED ORDER — NITROFURANTOIN MACROCRYSTAL 50 MG PO CAPS: ORAL_CAPSULE | ORAL | Status: DC

## 2015-09-01 ENCOUNTER — Telehealth: Payer: Self-pay | Admitting: Family Medicine

## 2015-09-01 NOTE — Telephone Encounter (Signed)
[facility] calling regarding this patient, and needing additional info regarding a prescription  [phone] refrence number is [id number]

## 2015-09-01 NOTE — Telephone Encounter (Signed)
Paperwork faxed to [facility]

## 2015-09-02 NOTE — Telephone Encounter (Signed)
Toll Free # for [employer] [phone]

## 2015-09-03 NOTE — Telephone Encounter (Signed)
Spoke to [facility] and updated information and they will be faxing me a decision.

## 2015-09-04 MED ORDER — NITROFURANTOIN MACROCRYSTAL 50 MG PO CAPS: ORAL_CAPSULE | ORAL | Status: DC

## 2015-09-04 NOTE — Telephone Encounter (Signed)
Approved from 09/03/14 - 04/10/2013 - Pharm and pt aware

## 2015-09-08 ENCOUNTER — Encounter: Payer: Self-pay | Admitting: Family Medicine

## 2016-06-30 ENCOUNTER — Ambulatory Visit (INDEPENDENT_AMBULATORY_CARE_PROVIDER_SITE_OTHER): Payer: PPO | Admitting: Family Medicine

## 2016-06-30 VITALS — BP 100/60 | HR 80 | Temp 97.9°F | Resp 14 | Ht 62.0 in | Wt 118.0 lb

## 2016-06-30 DIAGNOSIS — F458 Other somatoform disorders: Secondary | ICD-10-CM

## 2016-06-30 DIAGNOSIS — R0989 Other specified symptoms and signs involving the circulatory and respiratory systems: Secondary | ICD-10-CM

## 2016-06-30 MED ORDER — CETIRIZINE HCL 10 MG PO TABS: 10.0000 mg | ORAL_TABLET | Freq: Every day | ORAL | 11 refills | Status: DC

## 2016-06-30 MED ORDER — FLUTICASONE PROPIONATE 50 MCG/ACT NA SUSP: 2.0000 | Freq: Every day | NASAL | 6 refills | Status: DC

## 2016-06-30 NOTE — Progress Notes (Signed)
   Subjective:    Patient ID: Taylor Shah, female    DOB: 17-Sep-1950, 66 y.o.   MRN: [id number]  HPI Patient moved back from [place] one month ago. She is the wife of a missionary. Since returning to the [place], she has had a globus sensation in her larynx.  She denies any dysphagia to solids or liquids. She denies any choking. She denies any stridor. She does report postnasal drip and allergies which have been worse over the last 6 weeks. She denies any hemoptysis. She has no history of smoking or alcohol use Past Medical History:  Diagnosis Date  . Allergy    seasonal  . Arthritis   . Hyperlipidemia    Past Surgical History:  Procedure Laterality Date  . TUBAL LIGATION  1986   Current Outpatient Prescriptions on File Prior to Visit  Medication Sig Dispense Refill  . Calcium Carb-Cholecalciferol (CALCIUM 1000 + D) 1000-800 MG-UNIT TABS Take by mouth 2 (two) times daily.     . nitrofurantoin (MACRODANTIN) 50 MG capsule TAKE ONE CAPSULE BY MOUTH BID 60 capsule 0  . Nutritional Supplements (JUICE PLUS FIBRE) LIQD Take by mouth.    . vitamin C (ASCORBIC ACID) 500 MG tablet Take 500 mg by mouth daily.     No current facility-administered medications on file prior to visit.    Allergies  Allergen Reactions  . Shellfish Allergy Hives   Social History   Social History  . Marital status: Married    Spouse name: N/A  . Number of children: N/A  . Years of education: N/A   Occupational History  . Not on file.   Social History Main Topics  . Smoking status: Never Smoker  . Smokeless tobacco: Never Used  . Alcohol use Yes     Comment: Wine - occasional  . Drug use: No  . 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   Other Topics Concern  . Not on file   Social History Narrative  . No narrative on file    Review of Systems  All other systems reviewed and are negative.      Objective:   Physical Exam  Constitutional: She appears  well-developed and well-nourished. No distress.  HENT:  Right Ear: Tympanic membrane, external ear and ear canal normal.  Left Ear: Tympanic membrane, external ear and ear canal normal.  Nose: Mucosal edema and rhinorrhea present. Right sinus exhibits no frontal sinus tenderness. Left sinus exhibits no frontal sinus tenderness.  Mouth/Throat: Oropharynx is clear and moist. No oropharyngeal exudate.  Eyes: Conjunctivae are normal.  Neck: Neck supple.  Cardiovascular: Normal rate, regular rhythm and normal heart sounds.   Pulmonary/Chest: Effort normal and breath sounds normal. No respiratory distress. She has no wheezes. She has no rales.  Lymphadenopathy:    She has no cervical adenopathy.  Skin: She is not diaphoretic.  Vitals reviewed.         Assessment & Plan:  I believe the patient's globus sensation is likely due to postnasal drip from allergies. Begin Zyrtec 10 mg a day and Flonase 2 sprays each nostril daily. I will consult ENT for possible laryngoscopy if not improving

## 2016-10-03 ENCOUNTER — Ambulatory Visit (INDEPENDENT_AMBULATORY_CARE_PROVIDER_SITE_OTHER): Payer: PPO | Admitting: Physician Assistant

## 2016-10-03 ENCOUNTER — Encounter: Payer: Self-pay | Admitting: Physician Assistant

## 2016-10-03 VITALS — BP 124/70 | HR 86 | Temp 98.5°F | Resp 16 | Wt 120.8 lb

## 2016-10-03 DIAGNOSIS — J988 Other specified respiratory disorders: Secondary | ICD-10-CM | POA: Diagnosis not present

## 2016-10-03 DIAGNOSIS — B9689 Other specified bacterial agents as the cause of diseases classified elsewhere: Principal | ICD-10-CM

## 2016-10-03 MED ORDER — AZITHROMYCIN 250 MG PO TABS: ORAL_TABLET | ORAL | 0 refills | Status: DC

## 2016-10-03 NOTE — Progress Notes (Signed)
    Patient ID: Taylor Shah MRN: [id number], DOB: Mar 31, 1951, 66 y.o. Date of Encounter: 10/03/2016, 11:28 AM    Chief Complaint:  Chief Complaint  Patient presents with  . Sore Throat    started wed night   . Nasal Congestion  . head congestion  . nasal drainage     HPI: 66 y.o. year old female presents with above. Symptoms started 5 days ago. States that last Thursday morning she had a really bad sore throat. It has gotten better and does not feel as sore and painful but it just isn't going away. Visit she has not been feeling good in general but has had no fever. Is having drainage down her throat. Had wondered if it could be allergies but says she has had no sneezing or usual allergy symptoms. Has not blowing much out of her nose at all. Has had some cough that seems to be coming from her upper chest/drainage down her throat. Has not felt like she has any real deep chest congestion. No other symptoms. No other concerns.     Home Meds:   Outpatient Medications Prior to Visit  Medication Sig Dispense Refill  . Calcium Carb-Cholecalciferol (CALCIUM 1000 + D) 1000-800 MG-UNIT TABS Take by mouth 2 (two) times daily.     . cetirizine (ZYRTEC) 10 MG tablet Take 1 tablet (10 mg total) by mouth daily. 30 tablet 11  . fluticasone (FLONASE) 50 MCG/ACT nasal spray Place 2 sprays into both nostrils daily. 16 g 6  . nitrofurantoin (MACRODANTIN) 50 MG capsule TAKE ONE CAPSULE BY MOUTH BID 60 capsule 0  . Nutritional Supplements (JUICE PLUS FIBRE) LIQD Take by mouth.    . vitamin C (ASCORBIC ACID) 500 MG tablet Take 500 mg by mouth daily.     No facility-administered medications prior to visit.     Allergies:  Allergies  Allergen Reactions  . Shellfish Allergy Hives      Review of Systems: See HPI for pertinent ROS. All other ROS negative.    Physical Exam: Blood pressure 124/70, pulse 86, temperature 98.5 F (36.9 C), temperature source Oral, resp. rate 16, weight 120 lb 12.8 oz  (54.8 kg), SpO2 99 %., Body mass index is 22.09 kg/m. General:  WNWD WF. Appears in no acute distress. HEENT: Normocephalic, atraumatic, eyes without discharge, sclera non-icteric, nares are without discharge. Bilateral auditory canals clear, TM's are without perforation, pearly grey and translucent with reflective cone of light bilaterally. Oral cavity moist, posterior pharynx with mild erythema. No exudate, no peritonsillar abscess. No tenderness with percussion to frontal or maxillary sinuses bilaterally.  Neck: Supple. No thyromegaly. No lymphadenopathy. Lungs: Clear bilaterally to auscultation without wheezes, rales, or rhonchi. Breathing is unlabored. Heart: Regular rhythm. No murmurs, rubs, or gallops. Msk:  Strength and tone normal for age. Extremities/Skin: Warm and dry.  Neuro: Alert and oriented X 3. Moves all extremities spontaneously. Gait is normal. CNII-XII grossly in tact. Psych:  Responds to questions appropriately with a normal affect.     ASSESSMENT AND PLAN:  66 y.o. year old female with  1. Bacterial respiratory infection She is to take antibiotic as directed. Follow-up if symptoms do not resolve within 1 week after completion of antibiotic. - azithromycin (ZITHROMAX) 250 MG tablet; Day 1: Take 2 daily. Days 2 - 5: Take 1 daily.  Dispense: 6 tablet; Refill: 0   Signed, [address] [place], [place], [facility] 10/03/2016 11:28 AM

## 2016-12-30 ENCOUNTER — Other Ambulatory Visit: Payer: Self-pay | Admitting: Family Medicine

## 2016-12-30 MED ORDER — NITROFURANTOIN MACROCRYSTAL 50 MG PO CAPS: ORAL_CAPSULE | ORAL | 0 refills | Status: DC

## 2017-05-09 ENCOUNTER — Ambulatory Visit (INDEPENDENT_AMBULATORY_CARE_PROVIDER_SITE_OTHER): Payer: PPO | Admitting: Family Medicine

## 2017-05-09 ENCOUNTER — Encounter: Payer: Self-pay | Admitting: Family Medicine

## 2017-05-09 VITALS — BP 110/76 | HR 80 | Temp 98.1°F | Resp 14 | Ht 62.0 in | Wt 121.0 lb

## 2017-05-09 DIAGNOSIS — L570 Actinic keratosis: Secondary | ICD-10-CM | POA: Diagnosis not present

## 2017-05-09 DIAGNOSIS — R1013 Epigastric pain: Secondary | ICD-10-CM | POA: Diagnosis not present

## 2017-05-09 MED ORDER — PANTOPRAZOLE SODIUM 40 MG PO TBEC: 40.0000 mg | DELAYED_RELEASE_TABLET | Freq: Every day | ORAL | 3 refills | Status: DC

## 2017-05-09 NOTE — Progress Notes (Signed)
Subjective:    Patient ID: Taylor Shah, female    DOB: 06/18/1950, 67 y.o.   MRN: [id number]  HPI  Patient presents today with 2 concerns.  #1 she has a small red spot on the right side of the tip of her nose.  It is approximately 2-3 mm in diameter.  It has fine white scale.  She states that has been there for several years.  It is not bleeding.  It is not growing.  It appears to be an actinic keratoses though I cannot rule out a small squamous cell carcinoma.  Second issue is gas and bloating.  She states that she is having more belching and more flatus recently.  She also reports a burning sensation in her stomach.  She is having more indigestion.  She denies any weight loss.  She denies any melena or hematochezia.  She is overdue for a mammogram.  She is due for a flu shot.  She is due for the pneumonia vaccine.  She is also due for a bone density test. Past Medical History:  Diagnosis Date  . Allergy    seasonal  . Arthritis   . Hyperlipidemia    Past Surgical History:  Procedure Laterality Date  . TUBAL LIGATION  1986   Current Outpatient Medications on File Prior to Visit  Medication Sig Dispense Refill  . Calcium Carb-Cholecalciferol (CALCIUM 1000 + D) 1000-800 MG-UNIT TABS Take by mouth 2 (two) times daily.     . nitrofurantoin (MACRODANTIN) 50 MG capsule TAKE ONE CAPSULE BY MOUTH BID 60 capsule 0  . Nutritional Supplements (JUICE PLUS FIBRE) LIQD Take by mouth.    . vitamin C (ASCORBIC ACID) 500 MG tablet Take 500 mg by mouth daily.    . cetirizine (ZYRTEC) 10 MG tablet Take 1 tablet (10 mg total) by mouth daily. (Patient not taking: Reported on 05/09/2017) 30 tablet 11  . fluticasone (FLONASE) 50 MCG/ACT nasal spray Place 2 sprays into both nostrils daily. (Patient not taking: Reported on 05/09/2017) 16 g 6   No current facility-administered medications on file prior to visit.    Allergies  Allergen Reactions  . Shellfish Allergy Hives   Social History   Socioeconomic  History  . Marital status: Married    Spouse name: Not on file  . Number of children: Not on file  . Years of education: Not on file  . Highest education level: Not on file  Social Needs  . Financial resource strain: Not on file  . Food insecurity - worry: Not on file  . Food insecurity - inability: Not on file  . Transportation needs - medical: Not on file  . Transportation needs - non-medical: Not on file  Occupational History  . Not on file  Tobacco Use  . Smoking status: Never Smoker  . Smokeless tobacco: Never Used  Substance and Sexual Activity  . Alcohol use: Yes    Comment: Wine - occasional  . Drug use: No  . 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  Other Topics Concern  . Not on file  Social History Narrative  . Not on file     Review of Systems  All other systems reviewed and are negative.      Objective:   Physical Exam  HENT:  Head:    Nose:    Cardiovascular: Normal rate, regular rhythm and normal heart sounds.  Pulmonary/Chest: Effort normal and breath sounds normal. No respiratory distress.  She has no wheezes. She has no rales.  Abdominal: Soft. Bowel sounds are normal. She exhibits no distension. There is no tenderness. There is no rebound.  Vitals reviewed.         Assessment & Plan:  Dyspepsia  Actinic keratosis  I believe the lesion on the tip of her nose is an actinic keratosis.  I gave the patient 2 options.  We can either do cryotherapy today with liquid nitrogen but given how small it is to try to eradicate the lesion in its entirety or the lesion could be monitored clinically for any growth or change and then treated more aggressively later if the lesion starts to change.  I explained that actinic keratoses are precancers that turn into squamous cell carcinomas.  Patient elects to monitor clinically at the present time.  I believe the gas and bloating is likely a sequela of acid reflux.  We will try  the patient empirically on pantoprazole 40 mg a day and recheck in 1 month.  If symptoms persist, I would recommend checking for H. pylori.  I strongly recommended a mammogram, a bone density test, a flu shot, and a pneumonia vaccine.  Patient very politely declined them all at the present time

## 2017-05-19 ENCOUNTER — Encounter: Payer: Self-pay | Admitting: Family Medicine

## 2017-07-03 ENCOUNTER — Ambulatory Visit (INDEPENDENT_AMBULATORY_CARE_PROVIDER_SITE_OTHER): Payer: PPO | Admitting: Family Medicine

## 2017-07-03 VITALS — BP 128/64 | HR 64 | Temp 98.1°F | Ht 62.0 in | Wt 123.0 lb

## 2017-07-03 DIAGNOSIS — L989 Disorder of the skin and subcutaneous tissue, unspecified: Secondary | ICD-10-CM | POA: Diagnosis not present

## 2017-07-03 NOTE — Progress Notes (Signed)
Subjective:    Patient ID: Taylor Shah, female    DOB: December 23, 1950, 67 y.o.   MRN: [id number]  HPI Patient presents today with a suspicious skin lesion on her right nostril.  Lesion is approximately 3 mm in diameter.  It is red, hard, with white hyperkeratotic tissue and irregularly shaped.  I am concerned about an early squamous cell carcinoma.  Patient states that has been there for more than a year and will not heal Past Medical History:  Diagnosis Date  . Allergy    seasonal  . Arthritis   . Hyperlipidemia    Past Surgical History:  Procedure Laterality Date  . TUBAL LIGATION  1986   Current Outpatient Medications on File Prior to Visit  Medication Sig Dispense Refill  . Calcium Carb-Cholecalciferol (CALCIUM 1000 + D) 1000-800 MG-UNIT TABS Take by mouth 2 (two) times daily.     . cetirizine (ZYRTEC) 10 MG tablet Take 1 tablet (10 mg total) by mouth daily. (Patient not taking: Reported on 05/09/2017) 30 tablet 11  . fluticasone (FLONASE) 50 MCG/ACT nasal spray Place 2 sprays into both nostrils daily. (Patient not taking: Reported on 05/09/2017) 16 g 6  . nitrofurantoin (MACRODANTIN) 50 MG capsule TAKE ONE CAPSULE BY MOUTH BID 60 capsule 0  . Nutritional Supplements (JUICE PLUS FIBRE) LIQD Take by mouth.    . pantoprazole (PROTONIX) 40 MG tablet Take 1 tablet (40 mg total) by mouth daily. 30 tablet 3  . vitamin C (ASCORBIC ACID) 500 MG tablet Take 500 mg by mouth daily.     No current facility-administered medications on file prior to visit.    Allergies  Allergen Reactions  . Shellfish Allergy Hives   Social History   Socioeconomic History  . Marital status: Married    Spouse name: Not on file  . Number of children: Not on file  . Years of education: Not on file  . Highest education level: Not on file  Occupational History  . Not on file  Social Needs  . Financial resource strain: Not on file  . Food insecurity:    Worry: Not on file    Inability: Not on file  .  Transportation needs:    Medical: Not on file    Non-medical: Not on file  Tobacco Use  . Smoking status: Never Smoker  . Smokeless tobacco: Never Used  Substance and Sexual Activity  . Alcohol use: Yes    Comment: Wine - occasional  . Drug use: No  . 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 Physical activity:    Days per week: Not on file    Minutes per session: Not on file  . Stress: Not on file  Relationships  . Social connections:    Talks on phone: Not on file    Gets together: Not on file    Attends religious service: Not on file    Active member of club or organization: Not on file    Attends meetings of clubs or organizations: Not on file    Relationship status: Not on file  . Intimate partner violence:    Fear of current or ex partner: Not on file    Emotionally abused: Not on file    Physically abused: Not on file    Forced sexual activity: Not on file  Other Topics Concern  . Not on file  Social History Narrative  . Not on file  Review of Systems  All other systems reviewed and are negative.      Objective:   Physical Exam  Constitutional: She appears well-developed and well-nourished.  HENT:  Nose:    Cardiovascular: Normal rate and regular rhythm.  Pulmonary/Chest: Effort normal and breath sounds normal.  Vitals reviewed.         Assessment & Plan:  Non-healing skin lesion of nose  Given its small size, I believe we can adequately treat this with liquid nitrogen cryotherapy.  Lesion was treated for approximately 30 seconds with liquid nitrogen cryotherapy.  Wound care was discussed afterward.  If lesion persist greater than 4 weeks, return for a shave biopsy.

## 2017-07-10 ENCOUNTER — Other Ambulatory Visit: Payer: Self-pay | Admitting: Family Medicine

## 2017-07-17 ENCOUNTER — Other Ambulatory Visit: Payer: Self-pay | Admitting: Family Medicine

## 2017-07-17 DIAGNOSIS — Z1231 Encounter for screening mammogram for malignant neoplasm of breast: Secondary | ICD-10-CM

## 2017-07-24 DIAGNOSIS — Z961 Presence of intraocular lens: Secondary | ICD-10-CM | POA: Diagnosis not present

## 2017-07-24 DIAGNOSIS — H524 Presbyopia: Secondary | ICD-10-CM | POA: Diagnosis not present

## 2017-07-24 DIAGNOSIS — H52223 Regular astigmatism, bilateral: Secondary | ICD-10-CM | POA: Diagnosis not present

## 2017-07-24 DIAGNOSIS — H18413 Arcus senilis, bilateral: Secondary | ICD-10-CM | POA: Diagnosis not present

## 2017-08-11 ENCOUNTER — Ambulatory Visit
Admission: RE | Admit: 2017-08-11 | Discharge: 2017-08-11 | Disposition: A | Discharge status: Home / Self Care | Payer: PPO | Source: Ambulatory Visit | Attending: Family Medicine | Admitting: Family Medicine

## 2017-08-11 DIAGNOSIS — Z1231 Encounter for screening mammogram for malignant neoplasm of breast: Secondary | ICD-10-CM | POA: Diagnosis not present

## 2017-08-29 ENCOUNTER — Other Ambulatory Visit: Payer: Self-pay

## 2017-08-29 ENCOUNTER — Ambulatory Visit (INDEPENDENT_AMBULATORY_CARE_PROVIDER_SITE_OTHER): Payer: PPO | Admitting: Family Medicine

## 2017-08-29 ENCOUNTER — Encounter: Payer: Self-pay | Admitting: Family Medicine

## 2017-08-29 VITALS — BP 124/70 | HR 78 | Temp 98.3°F | Resp 12 | Ht 62.0 in | Wt 121.6 lb

## 2017-08-29 DIAGNOSIS — J029 Acute pharyngitis, unspecified: Secondary | ICD-10-CM

## 2017-08-29 DIAGNOSIS — J209 Acute bronchitis, unspecified: Secondary | ICD-10-CM | POA: Diagnosis not present

## 2017-08-29 DIAGNOSIS — J069 Acute upper respiratory infection, unspecified: Secondary | ICD-10-CM | POA: Diagnosis not present

## 2017-08-29 MED ORDER — BENZONATATE 100 MG PO CAPS: 100.0000 mg | ORAL_CAPSULE | Freq: Three times a day (TID) | ORAL | 0 refills | Status: DC | PRN

## 2017-08-29 MED ORDER — PREDNISONE 20 MG PO TABS: ORAL_TABLET | ORAL | 0 refills | Status: DC

## 2017-08-29 NOTE — Progress Notes (Signed)
Negative throat culture - pt will be notified

## 2017-08-29 NOTE — Progress Notes (Signed)
Patient ID: Taylor Shah, female    DOB: February 23, 1951, 67 y.o.   MRN: [id number]  PCP: Susy Frizzle, MD  Chief Complaint  Patient presents with  . Sore Throat    symptoms started saturday   . low grade fever    99.5  . nasal drainage  . Cough  . Headache    Subjective:   Taylor Shah is a 67 y.o. female, presents to clinic with CC of scratchy sore throat and nasal congestion with sinus pressure and frontal and temporal headache.  She has had profuse postnasal drip causing a productive cough with yellow sputum.  She reports associated "low-grade fever" with temperature 99.5, body aches, decreased energy, decreased appetite.  She has taken Tylenol for headache without much improvement.  She is also used Vicks VapoRub with small amount of improvement.  Her sore throat has been fairly constant, moderate in severity, described as scratchy and irritated, worse with swallowing, no other aggravating or alleviating factors.  She denies any shortness of breath, wheeze, chest tightness, chest pain, night sweats, weight loss.  No exertional dyspnea, orthopnea, PND, lower extremity edema, palpitations, near syncope. She initially thought it might be her allergies but because she developed a fever she has not tried taking her allergy medications which she was prescribed roughly 2 months ago.  Patient Active Problem List   Diagnosis Date Noted  . Hyperlipidemia      Prior to Admission medications   Medication Sig Start Date End Date Taking? Authorizing Provider  Calcium Carb-Cholecalciferol (CALCIUM 1000 + D) 1000-800 MG-UNIT TABS Take by mouth 2 (two) times daily.    Yes [provider]  cetirizine (ZYRTEC) 10 MG tablet Take 1 tablet (10 mg total) by mouth daily. 06/30/16  Yes Susy Frizzle, MD  fluticasone (FLONASE) 50 MCG/ACT nasal spray PLACE 2 SPRAYS INTO BOTH NOSTRILS DAILY. 07/10/17  Yes Susy Frizzle, MD  Nutritional Supplements (JUICE PLUS FIBRE) LIQD Take by mouth.   Yes  [provider]  pantoprazole (PROTONIX) 40 MG tablet Take 1 tablet (40 mg total) by mouth daily. 05/09/17  Yes Susy Frizzle, MD  vitamin C (ASCORBIC ACID) 500 MG tablet Take 500 mg by mouth daily.   Yes [provider]     Allergies  Allergen Reactions  . Shellfish Allergy Hives     Family History  Problem Relation Age of Onset  . Heart disease Father   . Hyperlipidemia Brother   . Stroke Paternal Grandmother   . Colon cancer Paternal Grandfather 15     Social History   Socioeconomic History  . Marital status: Married    Spouse name: Not on file  . Number of children: Not on file  . Years of education: Not on file  . Highest education level: Not on file  Occupational History  . Not on file  Social Needs  . Financial resource strain: Not on file  . Food insecurity:    Worry: Not on file    Inability: Not on file  . Transportation needs:    Medical: Not on file    Non-medical: Not on file  Tobacco Use  . Smoking status: Never Smoker  . Smokeless tobacco: Never Used  Substance and Sexual Activity  . Alcohol use: Yes    Comment: Wine - occasional  . Drug use: No  . 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 Physical activity:  Days per week: Not on file    Minutes per session: Not on file  . Stress: Not on file  Relationships  . Social connections:    Talks on phone: Not on file    Gets together: Not on file    Attends religious service: Not on file    Active member of club or organization: Not on file    Attends meetings of clubs or organizations: Not on file    Relationship status: Not on file  . Intimate partner violence:    Fear of current or ex partner: Not on file    Emotionally abused: Not on file    Physically abused: Not on file    Forced sexual activity: Not on file  Other Topics Concern  . Not on file  Social History Narrative  . Not on file     Review of Systems    Constitutional: Positive for fatigue and fever. Negative for activity change, chills, diaphoresis and unexpected weight change.  HENT: Positive for congestion, postnasal drip, rhinorrhea, sinus pressure, sore throat and voice change. Negative for nosebleeds, sinus pain, sneezing, tinnitus and trouble swallowing.   Eyes: Negative.   Respiratory: Positive for cough. Negative for apnea, choking, chest tightness, shortness of breath, wheezing and stridor.   Cardiovascular: Negative.  Negative for chest pain, palpitations and leg swelling.  Gastrointestinal: Negative.  Negative for abdominal pain, constipation, diarrhea, nausea and vomiting.  Endocrine: Negative.   Genitourinary: Negative.   Musculoskeletal: Positive for myalgias. Negative for arthralgias.  Skin: Negative.  Negative for color change, pallor, rash and wound.  Allergic/Immunologic: Negative.   Neurological: Positive for headaches. Negative for dizziness, tremors, seizures, syncope, facial asymmetry, speech difficulty, weakness, light-headedness and numbness.  Hematological: Negative.   Psychiatric/Behavioral: Negative.   All other systems reviewed and are negative.      Objective:    Vitals:   08/29/17 1208  BP: 124/70  Pulse: 78  Resp: 12  Temp: 98.3 F (36.8 C)  TempSrc: Oral  SpO2: 99%  Weight: 121 lb 9.6 oz (55.2 kg)  Height: 5\' 2"  (1.575 m)      Physical Exam  Constitutional: She is oriented to person, place, and time. She appears well-developed and well-nourished.  Non-toxic appearance. She does not appear ill. No distress.  HENT:  Head: Normocephalic and atraumatic.  Right Ear: Tympanic membrane, external ear and ear canal normal. No drainage, swelling or tenderness. No middle ear effusion.  Left Ear: Tympanic membrane, external ear and ear canal normal. No drainage, swelling or tenderness.  No middle ear effusion.  Nose: Nose normal.  Mouth/Throat: Uvula is midline and mucous membranes are normal. Mucous  membranes are not pale, not dry and not cyanotic. No oral lesions. No uvula swelling. Posterior oropharyngeal edema and posterior oropharyngeal erythema present. No oropharyngeal exudate or tonsillar abscesses. Tonsils are 1+ on the right. Tonsils are 1+ on the left. No tonsillar exudate.  No sinus tenderness to palpation Posterior oropharynx diffusely injected Nasal mucosa erythematous with scant clear discharge, bilateral turbinates enlarged  Eyes: Pupils are equal, round, and reactive to light. Conjunctivae, EOM and lids are normal. Right eye exhibits no discharge. Left eye exhibits no discharge.  Neck: Normal range of motion and phonation normal. Neck supple. No tracheal deviation present.  Cardiovascular: Normal rate, regular rhythm, normal heart sounds, intact distal pulses and normal pulses. Exam reveals no gallop and no friction rub.  No murmur heard. Pulses:      Radial pulses are 2+ on the  right side, and 2+ on the left side.       Posterior tibial pulses are 2+ on the right side, and 2+ on the left side.  Pulmonary/Chest: Effort normal and breath sounds normal. No stridor. No respiratory distress. She has no wheezes. She has no rhonchi. She has no rales. She exhibits no tenderness.  Occasional cough  Abdominal: Soft. Normal appearance and bowel sounds are normal. She exhibits no distension. There is no tenderness. There is no rebound and no guarding.  Musculoskeletal: Normal range of motion. She exhibits no edema or deformity.  Lymphadenopathy:    She has no cervical adenopathy.  Neurological: She is alert and oriented to person, place, and time. She exhibits normal muscle tone. Gait normal.  Skin: Skin is warm, dry and intact. Capillary refill takes less than 2 seconds. No rash noted. She is not diaphoretic. No erythema. No pallor.  Psychiatric: She has a normal mood and affect. Her speech is normal and behavior is normal.  Nursing note and vitals reviewed.         Assessment &  Plan:      ICD-10-CM   1. Pharyngitis, unspecified etiology J02.9 STREP GROUP A AG, W/REFLEX TO CULT    predniSONE (DELTASONE) 20 MG tablet  2. Upper respiratory tract infection, unspecified type J06.9   3. Acute bronchitis, unspecified organism J20.9 predniSONE (DELTASONE) 20 MG tablet    benzonatate (TESSALON) 100 MG capsule    Suspect viral URI with very mild or very early bronchitis.  Patient is well-appearing with stable vital signs within normal limits.  She has diffuse erythema in her nasal mucosa and oropharynx, patchy voice, lungs clear to auscultation, no other concerning exam findings.  Feel that the swelling to throat and coughing symptoms may improve with a short burst of steroids.  Do not feel antibiotics are indicated at this time.  Patient to start trying Sudafed, Mucinex, resume taking allergy medications.    Delsa Grana, PA-C 08/29/17 12:36 PM

## 2017-08-29 NOTE — Patient Instructions (Addendum)
Get sudafed, mucinex (or mucinex DM) and start your allergy meds again, particularly your nasal steroid spray.  Cover your cough and practice good hand hygiene  Cough suppressant and a burst of steroids as prescribed and sent to the pharmacy   Please call us if you get severe facial pain with a spiked fever  Or if you have any shortness of breath or chest tightness, you may need to be rechecked  Upper Respiratory Infection, Adult Most upper respiratory infections (URIs) are caused by a virus. A URI affects the nose, throat, and upper air passages. The most common type of URI is often called "the common cold." Follow these instructions at home:  Take medicines only as told by your doctor.  Gargle warm saltwater or take cough drops to comfort your throat as told by your doctor.  Use a warm mist humidifier or inhale steam from a shower to increase air moisture. This may make it easier to breathe.  Drink enough fluid to keep your pee (urine) clear or pale yellow.  Eat soups and other clear broths.  Have a healthy diet.  Rest as needed.  Go back to work when your fever is gone or your doctor says it is okay. ? You may need to stay home longer to avoid giving your URI to others. ? You can also wear a face mask and wash your hands often to prevent spread of the virus.  Use your inhaler more if you have asthma.  Do not use any tobacco products, including cigarettes, chewing tobacco, or electronic cigarettes. If you need help quitting, ask your doctor. Contact a doctor if:  You are getting worse, not better.  Your symptoms are not helped by medicine.  You have chills.  You are getting more short of breath.  You have brown or red mucus.  You have yellow or brown discharge from your nose.  You have pain in your face, especially when you bend forward.  You have a fever.  You have puffy (swollen) neck glands.  You have pain while swallowing.  You have white areas in the  back of your throat. Get help right away if:  You have very bad or constant: ? Headache. ? Ear pain. ? Pain in your forehead, behind your eyes, and over your cheekbones (sinus pain). ? Chest pain.  You have long-lasting (chronic) lung disease and any of the following: ? Wheezing. ? Long-lasting cough. ? Coughing up blood. ? A change in your usual mucus.  You have a stiff neck.  You have changes in your: ? Vision. ? Hearing. ? Thinking. ? Mood. This information is not intended to replace advice given to you by your health care provider. Make sure you discuss any questions you have with your health care provider. Document Released: 09/14/2007 Document Revised: 11/29/2015 Document Reviewed: 07/03/2013 Elsevier Interactive Patient Education  2018 [employer].

## 2017-08-31 LAB — STREP GROUP A AG, W/REFLEX TO CULT: STREPTOCOCCUS, GROUP A SCREEN (DIRECT): NOT DETECTED

## 2017-08-31 LAB — CULTURE, GROUP A STREP
MICRO NUMBER: 90616025
SPECIMEN QUALITY: ADEQUATE

## 2017-08-31 NOTE — Progress Notes (Signed)
Negative throat culture - pt will be notified

## 2018-04-17 ENCOUNTER — Other Ambulatory Visit: Payer: Self-pay | Admitting: *Deleted

## 2018-04-17 ENCOUNTER — Other Ambulatory Visit: Payer: PPO

## 2018-04-17 ENCOUNTER — Encounter: Payer: Self-pay | Admitting: Family Medicine

## 2018-04-17 DIAGNOSIS — Z021 Encounter for pre-employment examination: Secondary | ICD-10-CM

## 2018-04-17 DIAGNOSIS — Z111 Encounter for screening for respiratory tuberculosis: Secondary | ICD-10-CM

## 2018-04-20 LAB — QUANTIFERON-TB GOLD PLUS
MITOGEN-NIL: 6.83 [IU]/mL
NIL: 0.02 [IU]/mL
QUANTIFERON-TB GOLD PLUS: NEGATIVE
TB1-NIL: 0 [IU]/mL
TB2-NIL: 0 IU/mL

## 2018-05-04 ENCOUNTER — Encounter: Payer: Self-pay | Admitting: Family Medicine

## 2018-05-04 ENCOUNTER — Ambulatory Visit (INDEPENDENT_AMBULATORY_CARE_PROVIDER_SITE_OTHER): Payer: PPO | Admitting: Family Medicine

## 2018-05-04 VITALS — BP 102/62 | HR 80 | Temp 98.0°F | Resp 16 | Wt 113.2 lb

## 2018-05-04 DIAGNOSIS — R319 Hematuria, unspecified: Secondary | ICD-10-CM

## 2018-05-04 DIAGNOSIS — R1030 Lower abdominal pain, unspecified: Secondary | ICD-10-CM

## 2018-05-04 DIAGNOSIS — R198 Other specified symptoms and signs involving the digestive system and abdomen: Secondary | ICD-10-CM | POA: Diagnosis not present

## 2018-05-04 LAB — URINALYSIS, ROUTINE W REFLEX MICROSCOPIC
BACTERIA UA: NONE SEEN /HPF
Bilirubin Urine: NEGATIVE
Glucose, UA: NEGATIVE
Hyaline Cast: NONE SEEN /LPF
Ketones, ur: NEGATIVE
LEUKOCYTES UA: NEGATIVE
Nitrite: NEGATIVE
Protein, ur: NEGATIVE
Specific Gravity, Urine: 1.025 (ref 1.001–1.03)
WBC, UA: NONE SEEN /HPF (ref 0–5)
pH: 5.5 (ref 5.0–8.0)

## 2018-05-04 LAB — MICROSCOPIC MESSAGE

## 2018-05-04 NOTE — Progress Notes (Signed)
Patient ID: Taylor Shah, female    DOB: October 15, 1950, 68 y.o.   MRN: [id number]  PCP: Susy Frizzle, MD  Chief Complaint  Patient presents with  . Abdominal Pain    Has c/o gas pains with bowel movements. Onset 2 months ago.     Subjective:   Taylor Shah is a 68 y.o. female, presents to clinic with: Abdominal Pain  This is a new problem. Episode onset: 2 - 2.5 months. The onset quality is sudden. Episode frequency: intermittently, but now daily pain with bowel movements. The problem has been gradually worsening. The pain is located in the suprapubic region. The pain is at a severity of 6/10. The quality of the pain is cramping (gas, feels likes its trying to push it out and it hurts, feels like pain with paristalsis). The abdominal pain does not radiate. Associated symptoms include belching and weight loss (with going vegan). Pertinent negatives include no anorexia, arthralgias, constipation, diarrhea, fever, flatus, frequency, hematochezia, hematuria, melena, nausea or vomiting. The pain is aggravated by bowel movement.  Pt tried adjusting foods suspecting it might be IBS, but no improvement. Diet in November when symptoms started they had adjusted to a vegan diet, fodmap - special diet for IBS - cut out gluten and dairy Patient states she has No hx abd surgery Tubal ligation Colonoscopy 11/22/12 normal, repeat 10 years  Meds tried - tums, probiotics Intermittent indigestion  Patient Active Problem List   Diagnosis Date Noted  . Hyperlipidemia      Prior to Admission medications   Medication Sig Start Date End Date Taking? Authorizing Provider  benzonatate (TESSALON) 100 MG capsule Take 1 capsule (100 mg total) by mouth 3 (three) times daily as needed for cough. 08/29/17   Delsa Grana, PA-C  Calcium Carb-Cholecalciferol (CALCIUM 1000 + D) 1000-800 MG-UNIT TABS Take by mouth 2 (two) times daily.     [provider]  cetirizine (ZYRTEC) 10 MG tablet Take 1 tablet (10  mg total) by mouth daily. 06/30/16   Susy Frizzle, MD  fluticasone (FLONASE) 50 MCG/ACT nasal spray PLACE 2 SPRAYS INTO BOTH NOSTRILS DAILY. 07/10/17   Susy Frizzle, MD  Nutritional Supplements (JUICE PLUS FIBRE) LIQD Take by mouth.    [provider]  pantoprazole (PROTONIX) 40 MG tablet Take 1 tablet (40 mg total) by mouth daily. 05/09/17   Susy Frizzle, MD  predniSONE (DELTASONE) 20 MG tablet 2 tabs poqday 1-3, 1 tabs poqday 4-6 08/29/17   Delsa Grana, PA-C  vitamin C (ASCORBIC ACID) 500 MG tablet Take 500 mg by mouth daily.    [provider]     Allergies  Allergen Reactions  . Shellfish Allergy Hives     Family History  Problem Relation Age of Onset  . Heart disease Father   . Hyperlipidemia Brother   . Stroke Paternal Grandmother   . Colon cancer Paternal Grandfather 14     Social History   Socioeconomic History  . Marital status: Married    Spouse name: Not on file  . Number of children: Not on file  . Years of education: Not on file  . Highest education level: Not on file  Occupational History  . Not on file  Social Needs  . Financial resource strain: Not on file  . Food insecurity:    Worry: Not on file    Inability: Not on file  . Transportation needs:    Medical: Not on file  Non-medical: Not on file  Tobacco Use  . Smoking status: Never Smoker  . Smokeless tobacco: Never Used  Substance and Sexual Activity  . Alcohol use: Yes    Comment: Wine - occasional  . Drug use: No  . 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 Physical activity:    Days per week: Not on file    Minutes per session: Not on file  . Stress: Not on file  Relationships  . Social connections:    Talks on phone: Not on file    Gets together: Not on file    Attends religious service: Not on file    Active member of club or organization: Not on file    Attends meetings of clubs or organizations: Not on  file    Relationship status: Not on file  . Intimate partner violence:    Fear of current or ex partner: Not on file    Emotionally abused: Not on file    Physically abused: Not on file    Forced sexual activity: Not on file  Other Topics Concern  . Not on file  Social History Narrative  . Not on file     Review of Systems  Constitutional: Positive for weight loss (with going vegan). Negative for fever.  Gastrointestinal: Positive for abdominal pain. Negative for anorexia, constipation, diarrhea, flatus, hematochezia, melena, nausea and vomiting.  Genitourinary: Negative for frequency and hematuria.  Musculoskeletal: Negative for arthralgias.       Objective:    Vitals:   05/04/18 1213  BP: 102/62  Pulse: 80  Resp: 16  Temp: 98 F (36.7 C)  TempSrc: Oral  SpO2: 99%  Weight: 113 lb 4 oz (51.4 kg)      Physical Exam Vitals signs and nursing note reviewed.  Constitutional:      General: She is not in acute distress.    Appearance: Normal appearance. She is well-developed and normal weight. She is not ill-appearing, toxic-appearing or diaphoretic.  HENT:     Head: Normocephalic and atraumatic.     Right Ear: External ear normal.     Left Ear: External ear normal.     Nose: Nose normal.     Mouth/Throat:     Mouth: Mucous membranes are moist.     Pharynx: Oropharynx is clear. Uvula midline.  Eyes:     General: Lids are normal. No scleral icterus.    Conjunctiva/sclera: Conjunctivae normal.     Pupils: Pupils are equal, round, and reactive to light.  Neck:     Musculoskeletal: Normal range of motion and neck supple.     Trachea: Phonation normal. No tracheal deviation.  Cardiovascular:     Rate and Rhythm: Normal rate and regular rhythm.     Pulses: Normal pulses.          Radial pulses are 2+ on the right side and 2+ on the left side.       Posterior tibial pulses are 2+ on the right side and 2+ on the left side.     Heart sounds: Normal heart sounds. No  murmur. No friction rub. No gallop.   Pulmonary:     Effort: Pulmonary effort is normal. No respiratory distress.     Breath sounds: Normal breath sounds. No stridor. No wheezing, rhonchi or rales.  Chest:     Chest wall: No tenderness.  Abdominal:     General: Abdomen is flat. Bowel sounds are  normal. There is no distension or abdominal bruit.     Palpations: Abdomen is soft. There is no hepatomegaly, splenomegaly or mass.     Tenderness: There is no abdominal tenderness. There is no right CVA tenderness, left CVA tenderness, guarding or rebound. Negative signs include Murphy's sign and McBurney's sign.     Hernia: No hernia is present.  Musculoskeletal: Normal range of motion.        General: No deformity.  Lymphadenopathy:     Cervical: No cervical adenopathy.  Skin:    General: Skin is warm and dry.     Capillary Refill: Capillary refill takes less than 2 seconds.     Coloration: Skin is not pale.     Findings: No rash.  Neurological:     Mental Status: She is alert and oriented to person, place, and time.     Motor: No abnormal muscle tone.     Gait: Gait normal.  Psychiatric:        Speech: Speech normal.        Behavior: Behavior normal.           Assessment & Plan:      ICD-10-CM   1. Lower abdominal pain T46.56 COMPLETE METABOLIC PANEL WITH GFR    CBC with Differential/Platelet    Lipase    Fecal Globin By Immunochemistry    Urinalysis, Routine w reflex microscopic    68 year old female presents with 2 to 3 months of gradually worsening abdominal pain that is located across the low abdomen only occurs with bowel movements but is very severe worsening in frequency and severity.  Her physical exam is unremarkable benign abdomen.  She is having bowel movements every 2 to 3 days but she states that stools are formed and soft no straining no constipation no diarrhea no melena no hematochezia no tenesmus no rectal pain or hemorrhoids  Reviewed her medical history last  colonoscopy was 2014 which was negative, she has not had labs here in the clinic for more than 5 years, she gives a past medical history of IBS, has modified her diet very carefully for the past several months and her pain is only worsened she is currently gluten-free dairy free.  Vaginal or urinary symptoms  Will screen with sick labs, Hemoccult, UA, start treatment by using over-the-counter gas and indigestion medicines such as simethicone, low dose MiraLAX to see if we can get her to have BM more frequently (goal - daily) and see if that helps, I did not feel any masses in her abdomen but I do think that she may need to get to GI and have flex sig or be checked in the anatomy in descending colon sigmoid colon and rectum.  Her exam is not suspicious for diverticulitis, checking for anemia and Hemoccult for CRC would like to r/o, but there is no signs of blood loss, anemia or obstructing masses changing the size of her stool.  May just be IBS.  Patient was asked to return a stool sample, follow-up with [place] in 1-2 weeks to let us know if tx is helping improve sx.   Delsa Grana, PA-C 05/04/18 12:16 PM

## 2018-05-04 NOTE — Addendum Note (Signed)
Addended by: Delsa Grana on: 05/04/2018 02:38 PM   Modules accepted: Orders

## 2018-05-04 NOTE — Patient Instructions (Signed)
Continue your diet as is, I would make sure to push ample clear fluids  Try 1/2 dose of miralax - sample provided Can do 1/2 dose daily and increase or decrease the dose or frequency to get to the goal of soft daily stool, but not diarrhea  If there is no success with miralax, you can try linzess - take one by mouth in the am, and it should help you have a bowel movement.  Would need to do on a day you could stay home.  Please call me in a week to see how these meds work.  We will notify you of your lab results.  I think GI would be very helpful to see.    Follow up sooner if any pain is worse or more constant

## 2018-05-05 LAB — COMPLETE METABOLIC PANEL WITH GFR
AG RATIO: 1.7 (calc) (ref 1.0–2.5)
ALKALINE PHOSPHATASE (APISO): 78 U/L (ref 33–130)
ALT: 12 U/L (ref 6–29)
AST: 18 U/L (ref 10–35)
Albumin: 4.3 g/dL (ref 3.6–5.1)
BUN: 13 mg/dL (ref 7–25)
CO2: 29 mmol/L (ref 20–32)
Calcium: 9.5 mg/dL (ref 8.6–10.4)
Chloride: 104 mmol/L (ref 98–110)
Creat: 0.9 mg/dL (ref 0.50–0.99)
GFR, Est African American: 76 mL/min/{1.73_m2} (ref >=60)
GFR, Est Non African American: 65 mL/min/{1.73_m2} (ref >=60)
Globulin: 2.6 g/dL (calc) (ref 1.9–3.7)
Glucose, Bld: 74 mg/dL (ref 65–99)
POTASSIUM: 4.8 mmol/L (ref 3.5–5.3)
Sodium: 141 mmol/L (ref 135–146)
Total Bilirubin: 0.5 mg/dL (ref 0.2–1.2)
Total Protein: 6.9 g/dL (ref 6.1–8.1)

## 2018-05-05 LAB — CBC WITH DIFFERENTIAL/PLATELET
Absolute Monocytes: 494 cells/uL (ref 200–950)
Basophils Absolute: 31 cells/uL (ref 0–200)
Basophils Relative: 0.5 %
Eosinophils Absolute: 73 cells/uL (ref 15–500)
Eosinophils Relative: 1.2 %
HCT: 38.7 % (ref 35.0–45.0)
Hemoglobin: 13.1 g/dL (ref 11.7–15.5)
Lymphs Abs: 2037 cells/uL (ref 850–3900)
MCH: 29.2 pg (ref 27.0–33.0)
MCHC: 33.9 g/dL (ref 32.0–36.0)
MCV: 86.2 fL (ref 80.0–100.0)
MPV: 9.7 fL (ref 7.5–12.5)
Monocytes Relative: 8.1 %
Neutro Abs: 3465 cells/uL (ref 1500–7800)
Neutrophils Relative %: 56.8 %
Platelets: 258 10*3/uL (ref 140–400)
RBC: 4.49 10*6/uL (ref 3.80–5.10)
RDW: 12.4 % (ref 11.0–15.0)
Total Lymphocyte: 33.4 %
WBC: 6.1 10*3/uL (ref 3.8–10.8)

## 2018-05-05 LAB — LIPASE: LIPASE: 18 U/L (ref 7–60)

## 2018-05-06 DIAGNOSIS — R1030 Lower abdominal pain, unspecified: Secondary | ICD-10-CM | POA: Diagnosis not present

## 2018-05-06 LAB — URINE CULTURE
MICRO NUMBER:: 101583
Result:: NO GROWTH
SPECIMEN QUALITY:: ADEQUATE

## 2018-05-07 ENCOUNTER — Other Ambulatory Visit: Payer: PPO

## 2018-05-07 NOTE — Addendum Note (Signed)
Addended by: Delsa Grana on: 05/07/2018 11:44 AM   Modules accepted: Orders

## 2018-05-08 ENCOUNTER — Other Ambulatory Visit: Payer: PPO

## 2018-05-08 ENCOUNTER — Other Ambulatory Visit: Payer: Self-pay | Admitting: Family Medicine

## 2018-05-08 DIAGNOSIS — R319 Hematuria, unspecified: Secondary | ICD-10-CM

## 2018-05-08 LAB — FECAL GLOBIN BY IMMUNOCHEMISTRY
FECAL GLOBIN RESULT:: NOT DETECTED
MICRO NUMBER:: 108646
SPECIMEN QUALITY:: ADEQUATE

## 2018-05-08 LAB — URINALYSIS, ROUTINE W REFLEX MICROSCOPIC
Bacteria, UA: NONE SEEN /HPF
Bilirubin Urine: NEGATIVE
Glucose, UA: NEGATIVE
Ketones, ur: NEGATIVE
Leukocytes, UA: NEGATIVE
Nitrite: NEGATIVE
Protein, ur: NEGATIVE
SPECIFIC GRAVITY, URINE: 1.02 (ref 1.001–1.03)
WBC, UA: NONE SEEN /HPF (ref 0–5)
pH: 7 (ref 5.0–8.0)

## 2018-05-08 LAB — MICROSCOPIC MESSAGE

## 2018-05-18 ENCOUNTER — Telehealth: Payer: Self-pay | Admitting: Family Medicine

## 2018-05-18 DIAGNOSIS — R319 Hematuria, unspecified: Secondary | ICD-10-CM

## 2018-05-18 NOTE — Telephone Encounter (Signed)
Spoke with patient and informed her that we would need to recheck a urine. Patient verbalized understanding and stated that she would drop in within the next week to do a urine sample.

## 2018-05-18 NOTE — Telephone Encounter (Signed)
AT some point would like for her to come back in and leave another UA to make sure hematuria is resolved - can you put in future UA for hematuria and notify pt - and I'm glad she's feeling better

## 2018-05-18 NOTE — Telephone Encounter (Signed)
Patient wanted to call and give you an update about her abdominal pain. She states that it is completely resolved and her bowel movements are normal.

## 2018-06-12 ENCOUNTER — Other Ambulatory Visit: Payer: Self-pay | Admitting: Family Medicine

## 2018-06-12 ENCOUNTER — Encounter: Payer: Self-pay | Admitting: Family Medicine

## 2018-06-12 MED ORDER — NITROFURANTOIN MACROCRYSTAL 50 MG PO CAPS: 50.0000 mg | ORAL_CAPSULE | Freq: Every day | ORAL | 1 refills | Status: DC | PRN

## 2018-06-26 ENCOUNTER — Other Ambulatory Visit: Payer: Self-pay

## 2018-06-26 ENCOUNTER — Ambulatory Visit (INDEPENDENT_AMBULATORY_CARE_PROVIDER_SITE_OTHER): Payer: PPO | Admitting: Family Medicine

## 2018-06-26 ENCOUNTER — Encounter: Payer: Self-pay | Admitting: Family Medicine

## 2018-06-26 VITALS — BP 120/70 | HR 84 | Temp 98.4°F | Resp 14 | Ht 62.0 in | Wt 116.0 lb

## 2018-06-26 DIAGNOSIS — L309 Dermatitis, unspecified: Secondary | ICD-10-CM

## 2018-06-26 MED ORDER — CLOBETASOL PROPIONATE 0.05 % EX CREA: 1.0000 "application " | TOPICAL_CREAM | Freq: Two times a day (BID) | CUTANEOUS | 0 refills | Status: DC

## 2018-06-26 NOTE — Progress Notes (Signed)
Subjective:    Patient ID: Taylor Shah, female    DOB: December 07, 1950, 68 y.o.   MRN: [id number]  HPI Patient has a spot on the medial side of her right lower leg that has been there for several weeks.  It is roughly the diameter of a nickel.  It does not have a well-circumscribed border.  It is irregular in shape.  The skin is erythematous and scaly.  The border is not sharp or serpiginous.  It does itch.  The patient has tried Neosporin and occlusion with a Band-Aid which seem to make it worse.  She tried cortisone cream which did not seem to help.  It is not spreading.  There is no other lesion on her leg.  It is just superior to numerous small varicose veins raising the concern of venous stasis dermatitis versus nummular eczema although ringworm is also on the differential.  An actinic keratosis will be less likely Past Medical History:  Diagnosis Date  . Allergy    seasonal  . Arthritis   . Hyperlipidemia    Past Surgical History:  Procedure Laterality Date  . TUBAL LIGATION  1986   Current Outpatient Medications on File Prior to Visit  Medication Sig Dispense Refill  . nitrofurantoin (MACRODANTIN) 50 MG capsule Take 1 capsule (50 mg total) by mouth daily as needed. 30 capsule 1  . vitamin C (ASCORBIC ACID) 500 MG tablet Take 500 mg by mouth daily.     No current facility-administered medications on file prior to visit.    Allergies  Allergen Reactions  . Shellfish Allergy Hives   Social History   Socioeconomic History  . Marital status: Married    Spouse name: Not on file  . Number of children: Not on file  . Years of education: Not on file  . Highest education level: Not on file  Occupational History  . Not on file  Social Needs  . Financial resource strain: Not on file  . Food insecurity:    Worry: Not on file    Inability: Not on file  . Transportation needs:    Medical: Not on file    Non-medical: Not on file  Tobacco Use  . Smoking status: Never Smoker  .  Smokeless tobacco: Never Used  Substance and Sexual Activity  . Alcohol use: Yes    Comment: Wine - occasional  . Drug use: No  . 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 Physical activity:    Days per week: Not on file    Minutes per session: Not on file  . Stress: Not on file  Relationships  . Social connections:    Talks on phone: Not on file    Gets together: Not on file    Attends religious service: Not on file    Active member of club or organization: Not on file    Attends meetings of clubs or organizations: Not on file    Relationship status: Not on file  . Intimate partner violence:    Fear of current or ex partner: Not on file    Emotionally abused: Not on file    Physically abused: Not on file    Forced sexual activity: Not on file  Other Topics Concern  . Not on file  Social History Narrative  . Not on file      Review of Systems  All other systems reviewed and are negative.  Objective:   Physical Exam  Constitutional: She appears well-developed and well-nourished. No distress.  Neck: Neck supple.  Cardiovascular: Normal rate, regular rhythm and normal heart sounds.  No murmur heard. Pulmonary/Chest: Effort normal and breath sounds normal. No respiratory distress. She has no wheezes. She has no rales.  Abdominal: Soft.  Musculoskeletal:       Feet:  Skin: She is not diaphoretic.  Vitals reviewed.         Assessment & Plan:  Dermatitis  Uncertain diagnosis.  KOH was performed today in the office and is negative.  Leading causes on the differential diagnosis include venous stasis dermatitis versus nummular eczema.  Actinic keratosis or squamous cell carcinoma is less likely.  I recommended a empiric trial of clobetasol 0.05% cream applied twice daily for 7 to 10 days.  If lesion persist, I would recommend a shave biopsy

## 2018-07-19 ENCOUNTER — Encounter: Payer: Self-pay | Admitting: Family Medicine

## 2018-07-19 ENCOUNTER — Other Ambulatory Visit: Payer: Self-pay | Admitting: Family Medicine

## 2018-07-19 MED ORDER — CLOBETASOL PROPIONATE 0.05 % EX CREA: 1.0000 "application " | TOPICAL_CREAM | Freq: Two times a day (BID) | CUTANEOUS | 0 refills | Status: DC

## 2018-07-31 ENCOUNTER — Encounter: Payer: Self-pay | Admitting: Family Medicine

## 2018-07-31 MED ORDER — CETIRIZINE HCL 10 MG PO TABS: 10.0000 mg | ORAL_TABLET | Freq: Every day | ORAL | 11 refills | Status: DC

## 2018-09-21 ENCOUNTER — Other Ambulatory Visit: Payer: Self-pay

## 2018-09-21 ENCOUNTER — Ambulatory Visit (INDEPENDENT_AMBULATORY_CARE_PROVIDER_SITE_OTHER): Payer: PPO | Admitting: Family Medicine

## 2018-09-21 ENCOUNTER — Encounter: Payer: Self-pay | Admitting: Family Medicine

## 2018-09-21 VITALS — BP 120/68 | HR 88 | Temp 98.9°F | Resp 14 | Ht 62.0 in | Wt 114.0 lb

## 2018-09-21 DIAGNOSIS — K581 Irritable bowel syndrome with constipation: Secondary | ICD-10-CM

## 2018-09-21 MED ORDER — LINACLOTIDE 72 MCG PO CAPS: 72.0000 ug | ORAL_CAPSULE | Freq: Every day | ORAL | 1 refills | Status: DC

## 2018-09-21 NOTE — Progress Notes (Signed)
Subjective:    Patient ID: Taylor Shah, female    DOB: 08-Apr-1951, 68 y.o.   MRN: [id number]  HPI  Patient is a very sweet 68 year old Caucasian female who presents with 7 months of intermittent lower abdominal pain.  She stated it began after she switched to a vegan diet.  She lost a lot of weight switching to the vegan diet but towards the end of that diet, she started developing intense crampy lower abdominal pain whenever she would have a bowel movement.  Therefore she stopped that diet.  This was approximately in November or December.  Ever since she has had intermittent episodes of intense crampy abdominal pain.  She will feel fine.  She will go to the bathroom to have a bowel movement and then she will have intense cramps.  She will have to sit there for 15 minutes to have a bowel movement.  When she looks in the toilet bowl there is no blood.  The stool is soft.  It is normal in size.  It is not hard or large.  She denies constipation.  Then she can go several days with no problems and having normal bowel movements.  It occurs a couple times a week.  Sometimes it occurs every day.  It is very hit and miss however it has been persistent over the last several months.  She denies any weight loss.  She denies any melena.  She denies any hematochezia.  She denies any fevers or chills or nausea or vomiting Past Medical History:  Diagnosis Date  . Allergy    seasonal  . Arthritis   . Hyperlipidemia    Past Surgical History:  Procedure Laterality Date  . TUBAL LIGATION  1986   Current Outpatient Medications on File Prior to Visit  Medication Sig Dispense Refill  . cetirizine (ZYRTEC) 10 MG tablet Take 1 tablet (10 mg total) by mouth daily. 30 tablet 11  . cholecalciferol (VITAMIN D3) 25 MCG (1000 UT) tablet Take 1,000 Units by mouth daily.    . clobetasol cream (TEMOVATE) 9.48 % Apply 1 application topically 2 (two) times daily. 30 g 0  . nitrofurantoin (MACRODANTIN) 50 MG capsule Take 1  capsule (50 mg total) by mouth daily as needed. 30 capsule 1  . vitamin C (ASCORBIC ACID) 500 MG tablet Take 500 mg by mouth daily.     No current facility-administered medications on file prior to visit.    Allergies  Allergen Reactions  . Shellfish Allergy Hives   Social History   Socioeconomic History  . Marital status: Married    Spouse name: Not on file  . Number of children: Not on file  . Years of education: Not on file  . Highest education level: Not on file  Occupational History  . Not on file  Social Needs  . Financial resource strain: Not on file  . Food insecurity    Worry: Not on file    Inability: Not on file  . Transportation needs    Medical: Not on file    Non-medical: Not on file  Tobacco Use  . Smoking status: Never Smoker  . Smokeless tobacco: Never Used  Substance and Sexual Activity  . Alcohol use: Yes    Comment: Wine - occasional  . Drug use: No  . 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 Physical activity    Days per week: Not on file  Minutes per session: Not on file  . Stress: Not on file  Relationships  . Social Herbalist on phone: Not on file    Gets together: Not on file    Attends religious service: Not on file    Active member of club or organization: Not on file    Attends meetings of clubs or organizations: Not on file    Relationship status: Not on file  . Intimate partner violence    Fear of current or ex partner: Not on file    Emotionally abused: Not on file    Physically abused: Not on file    Forced sexual activity: Not on file  Other Topics Concern  . Not on file  Social History Narrative  . Not on file     Review of Systems  All other systems reviewed and are negative.      Objective:   Physical Exam Vitals signs reviewed.  Cardiovascular:     Rate and Rhythm: Normal rate and regular rhythm.     Heart sounds: Normal heart sounds.  Pulmonary:      Effort: No respiratory distress.     Breath sounds: Normal breath sounds. No stridor. No wheezing or rhonchi.  Abdominal:     General: Abdomen is flat. There is no distension.     Palpations: Abdomen is soft. There is no mass.     Tenderness: There is no abdominal tenderness. There is no guarding or rebound.     Hernia: No hernia is present.           Assessment & Plan:  I suspect IBS.  I will try the patient on Linzess 74 mcg p.o. daily both a stool softener and as management for pain.  I have recommended that she add fiber to her diet.  I also recommended a probiotic.  I recommended that she keep a food journal over the next 2 weeks and record any food that she is eaten within 24 hours of having the pain to see if we can determine a pattern.  Reassess in 2 to 3 weeks.  If pain persists, I would recommend a CT scan of the abdomen and pelvis to evaluate for colitis.  Her last colonoscopy was 4 years ago and was clear but she may require colonoscopy if persistent.

## 2018-09-24 ENCOUNTER — Encounter: Payer: Self-pay | Admitting: Family Medicine

## 2018-11-07 ENCOUNTER — Other Ambulatory Visit: Payer: Self-pay

## 2019-04-15 ENCOUNTER — Other Ambulatory Visit: Payer: Self-pay

## 2019-04-15 ENCOUNTER — Ambulatory Visit (INDEPENDENT_AMBULATORY_CARE_PROVIDER_SITE_OTHER): Payer: PPO | Admitting: Family Medicine

## 2019-04-15 VITALS — BP 110/70 | HR 87 | Temp 97.7°F | Resp 12 | Ht 62.0 in | Wt 117.0 lb

## 2019-04-15 DIAGNOSIS — B351 Tinea unguium: Secondary | ICD-10-CM

## 2019-04-15 DIAGNOSIS — L03031 Cellulitis of right toe: Secondary | ICD-10-CM | POA: Diagnosis not present

## 2019-04-15 MED ORDER — TERBINAFINE HCL 250 MG PO TABS: 250.0000 mg | ORAL_TABLET | Freq: Every day | ORAL | 2 refills | Status: DC

## 2019-04-15 MED ORDER — SULFAMETHOXAZOLE-TRIMETHOPRIM 800-160 MG PO TABS: 1.0000 | ORAL_TABLET | Freq: Two times a day (BID) | ORAL | 0 refills | Status: DC

## 2019-04-15 NOTE — Progress Notes (Signed)
Subjective:    Patient ID: Taylor Shah, female    DOB: [birth year], 69 y.o.   MRN: [id number]  HPI Patient is a very pleasant 69 year old Caucasian female who presents today complaining of a painful toe on her right foot.  The second toe has a thick yellow dysmorphic toenail that is growing down and into the distal portion of the toe.  The skin over the proximal nail fold is erythematous and tender to touch.  It has been like that for weeks.  It is cool to the touch but it is sore and it causes her pain when she is walking.  There is no pus collection.  There is no warmth.  There is no evidence of cellulitis.  Please see the photograph below:   Past Medical History:  Diagnosis Date  . Allergy    seasonal  . Arthritis   . Hyperlipidemia    Past Surgical History:  Procedure Laterality Date  . TUBAL LIGATION  1986   Current Outpatient Medications on File Prior to Visit  Medication Sig Dispense Refill  . cetirizine (ZYRTEC) 10 MG tablet Take 1 tablet (10 mg total) by mouth daily. 30 tablet 11  . cholecalciferol (VITAMIN D3) 25 MCG (1000 UT) tablet Take 1,000 Units by mouth daily.    Marland Kitchen linaclotide (LINZESS) 72 MCG capsule Take 1 capsule (72 mcg total) by mouth daily before breakfast. 30 capsule 1  . nitrofurantoin (MACRODANTIN) 50 MG capsule Take 1 capsule (50 mg total) by mouth daily as needed. 30 capsule 1   No current facility-administered medications on file prior to visit.   Allergies  Allergen Reactions  . Shellfish Allergy Hives   Social History   Socioeconomic History  . Marital status: Married    Spouse name: Not on file  . Number of children: Not on file  . Years of education: Not on file  . Highest education level: Not on file  Occupational History  . Not on file  Tobacco Use  . Smoking status: Never Smoker  . Smokeless tobacco: Never Used  Substance and Sexual Activity  . Alcohol use: Yes    Comment: Wine - occasional  . Drug use: No  . 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  Other Topics Concern  . Not on file  Social History Narrative  . Not on file   Social Determinants of Health   Financial Resource Strain:   . Difficulty of Paying Living Expenses: Not on file  Food Insecurity:   . Worried About Charity fundraiser in the Last Year: Not on file  . Ran Out of Food in the Last Year: Not on file  Transportation Needs:   . Lack of Transportation (Medical): Not on file  . Lack of Transportation (Non-Medical): Not on file  Physical Activity:   . Days of Exercise per Week: Not on file  . Minutes of Exercise per Session: Not on file  Stress:   . Feeling of Stress : Not on file  Social Connections:   . Frequency of Communication with Friends and Family: Not on file  . Frequency of Social Gatherings with Friends and Family: Not on file  . Attends Religious Services: Not on file  . Active Member of Clubs or Organizations: Not on file  . Attends Archivist Meetings: Not on file  . Marital Status: Not on file  Intimate Partner Violence:   . Fear of Current or Ex-Partner: Not on  file  . Emotionally Abused: Not on file  . Physically Abused: Not on file  . Sexually Abused: Not on file      Review of Systems  All other systems reviewed and are negative.      Objective:   Physical Exam Vitals reviewed.  Cardiovascular:     Rate and Rhythm: Normal rate and regular rhythm.     Heart sounds: Normal heart sounds.  Pulmonary:     Effort: Pulmonary effort is normal.     Breath sounds: Normal breath sounds.  Musculoskeletal:        General: Tenderness and deformity present.  Skin:    Findings: Erythema present.           Assessment & Plan:  Onychomycosis - Plan: terbinafine (LAMISIL) 250 MG tablet  Chronic paronychia of toe of right foot  Patient appears to have a chronic paronychia of the right second toe coupled with onychomycosis of the right second toenail.  We discussed  removing the toenail for comfort and then treating the onychomycosis with Lamisil.  Patient is able to put up with the discomfort and the tenderness but she would like to start the Lamisil.  She will take 250 mg daily for up to 90 days.  I have recommended that we check liver function test every 30 days while on the medication.  If the skin around the nail becomes red hot and painful, this could be the sign of an acute paronychia due to staph.  She will then take Bactrim double strength tablets but only if this occurs.  This appears to be more of a chronic fungal paronychia I believe.  Patient is comfortable with this plan.  She can return at any time to remove the toenail if tender and painful.

## 2019-04-18 ENCOUNTER — Other Ambulatory Visit: Payer: Self-pay

## 2019-04-18 ENCOUNTER — Encounter: Payer: Self-pay | Admitting: Family Medicine

## 2019-04-18 ENCOUNTER — Ambulatory Visit (INDEPENDENT_AMBULATORY_CARE_PROVIDER_SITE_OTHER): Payer: PPO | Admitting: Family Medicine

## 2019-04-18 VITALS — BP 100/64 | HR 96 | Temp 96.3°F | Resp 16 | Ht 62.0 in | Wt 117.0 lb

## 2019-04-18 DIAGNOSIS — B351 Tinea unguium: Secondary | ICD-10-CM | POA: Diagnosis not present

## 2019-04-18 DIAGNOSIS — L03031 Cellulitis of right toe: Secondary | ICD-10-CM

## 2019-04-18 MED ORDER — HYDROCODONE-ACETAMINOPHEN 5-325 MG PO TABS: 1.0000 | ORAL_TABLET | Freq: Four times a day (QID) | ORAL | 0 refills | Status: DC | PRN

## 2019-04-18 NOTE — Progress Notes (Signed)
Subjective:    Patient ID: Taylor Shah, female    DOB: [birth year], 69 y.o.   MRN: [id number]  HPI  04/15/19 Patient is a very pleasant 69 year old Caucasian female who presents today complaining of a painful toe on her right foot.  The second toe has a thick yellow dysmorphic toenail that is growing down and into the distal portion of the toe.  The skin over the proximal nail fold is erythematous and tender to touch.  It has been like that for weeks.  It is cool to the touch but it is sore and it causes her pain when she is walking.  There is no pus collection.  There is no warmth.  There is no evidence of cellulitis.  Please see the photograph below:  At that time, my plan was: Patient appears to have a chronic paronychia of the right second toe coupled with onychomycosis of the right second toenail.  We discussed removing the toenail for comfort and then treating the onychomycosis with Lamisil.  Patient is able to put up with the discomfort and the tenderness but she would like to start the Lamisil.  She will take 250 mg daily for up to 90 days.  I have recommended that we check liver function test every 30 days while on the medication.  If the skin around the nail becomes red hot and painful, this could be the sign of an acute paronychia due to staph.  She will then take Bactrim double strength tablets but only if this occurs.  This appears to be more of a chronic fungal paronychia I believe.  Patient is comfortable with this plan.  She can return at any time to remove the toenail if tender and painful.  04/18/19 Patient is here today requesting that I remove the toenail due to pain.  She also does not want to have to take the Lamisil for 3 months.  I explained to the patient that if there is a fungal infection and I simply remove the toenail, the toenail could grow back dysmorphic again in the future.  Patient would like to try removing the toenail and then using Lotrimin cream on the underlying  nailbed and germinal matrix until the wound heals.  I do not feel that this would cause any problems however I am not certain that it would be effective however this is her preference. Past Medical History:  Diagnosis Date  . Allergy    seasonal  . Arthritis   . Hyperlipidemia    Past Surgical History:  Procedure Laterality Date  . TUBAL LIGATION  1986   Current Outpatient Medications on File Prior to Visit  Medication Sig Dispense Refill  . cetirizine (ZYRTEC) 10 MG tablet Take 1 tablet (10 mg total) by mouth daily. 30 tablet 11  . cholecalciferol (VITAMIN D3) 25 MCG (1000 UT) tablet Take 1,000 Units by mouth daily.    Marland Kitchen linaclotide (LINZESS) 72 MCG capsule Take 1 capsule (72 mcg total) by mouth daily before breakfast. 30 capsule 1  . nitrofurantoin (MACRODANTIN) 50 MG capsule Take 1 capsule (50 mg total) by mouth daily as needed. 30 capsule 1  . sulfamethoxazole-trimethoprim (BACTRIM DS) 800-160 MG tablet Take 1 tablet by mouth 2 (two) times daily. 14 tablet 0   No current facility-administered medications on file prior to visit.   Allergies  Allergen Reactions  . Shellfish Allergy Hives   Social History   Socioeconomic History  . Marital status: Married    Spouse name:  Not on file  . Number of children: Not on file  . Years of education: Not on file  . Highest education level: Not on file  Occupational History  . Not on file  Tobacco Use  . Smoking status: Never Smoker  . Smokeless tobacco: Never Used  Substance and Sexual Activity  . Alcohol use: Yes    Comment: Wine - occasional  . Drug use: No  . 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  Other Topics Concern  . Not on file  Social History Narrative  . Not on file   Social Determinants of Health   Financial Resource Strain:   . Difficulty of Paying Living Expenses: Not on file  Food Insecurity:   . Worried About Charity fundraiser in the Last Year: Not on file  . Ran Out of  Food in the Last Year: Not on file  Transportation Needs:   . Lack of Transportation (Medical): Not on file  . Lack of Transportation (Non-Medical): Not on file  Physical Activity:   . Days of Exercise per Week: Not on file  . Minutes of Exercise per Session: Not on file  Stress:   . Feeling of Stress : Not on file  Social Connections:   . Frequency of Communication with Friends and Family: Not on file  . Frequency of Social Gatherings with Friends and Family: Not on file  . Attends Religious Services: Not on file  . Active Member of Clubs or Organizations: Not on file  . Attends Archivist Meetings: Not on file  . Marital Status: Not on file  Intimate Partner Violence:   . Fear of Current or Ex-Partner: Not on file  . Emotionally Abused: Not on file  . Physically Abused: Not on file  . Sexually Abused: Not on file      Review of Systems  All other systems reviewed and are negative.      Objective:   Physical Exam Vitals reviewed.  Cardiovascular:     Rate and Rhythm: Normal rate and regular rhythm.     Heart sounds: Normal heart sounds.  Pulmonary:     Effort: Pulmonary effort is normal.     Breath sounds: Normal breath sounds.  Musculoskeletal:        General: Tenderness and deformity present.  Skin:    Findings: Erythema present.           Assessment & Plan:  Onychomycosis  Chronic paronychia of toe of right foot  Anesthesia was achieved using a digital block with 0.1% lidocaine.  The nail was then separated from the underlying nailbed using a metal elevator.  The thick dysmorphic nail was then removed using traction.  The wound was then covered with Neosporin, wrapped in petroleum gauze, and wrapped in Coban.  Wound care was discussed.  Patient can use hydrocodone/acetaminophen 5/325 1 p.o. every 6 hours as needed pain if necessary.

## 2019-05-23 ENCOUNTER — Encounter: Payer: Self-pay | Admitting: Family Medicine

## 2019-05-24 DIAGNOSIS — Z79899 Other long term (current) drug therapy: Secondary | ICD-10-CM | POA: Diagnosis not present

## 2019-05-24 DIAGNOSIS — H524 Presbyopia: Secondary | ICD-10-CM | POA: Diagnosis not present

## 2019-05-24 DIAGNOSIS — H2513 Age-related nuclear cataract, bilateral: Secondary | ICD-10-CM | POA: Diagnosis not present

## 2019-05-24 DIAGNOSIS — H18593 Other hereditary corneal dystrophies, bilateral: Secondary | ICD-10-CM | POA: Diagnosis not present

## 2019-06-10 ENCOUNTER — Other Ambulatory Visit: Payer: Self-pay | Admitting: Family Medicine

## 2019-06-13 DIAGNOSIS — H25812 Combined forms of age-related cataract, left eye: Secondary | ICD-10-CM | POA: Diagnosis not present

## 2019-06-13 DIAGNOSIS — H2512 Age-related nuclear cataract, left eye: Secondary | ICD-10-CM | POA: Diagnosis not present

## 2019-06-24 DIAGNOSIS — D23121 Other benign neoplasm of skin of left upper eyelid, including canthus: Secondary | ICD-10-CM | POA: Diagnosis not present

## 2019-07-04 DIAGNOSIS — H2511 Age-related nuclear cataract, right eye: Secondary | ICD-10-CM | POA: Diagnosis not present

## 2019-07-04 DIAGNOSIS — H25811 Combined forms of age-related cataract, right eye: Secondary | ICD-10-CM | POA: Diagnosis not present

## 2019-08-14 ENCOUNTER — Other Ambulatory Visit: Payer: Self-pay | Admitting: Family Medicine

## 2019-08-14 ENCOUNTER — Encounter: Payer: Self-pay | Admitting: Family Medicine

## 2019-08-14 MED ORDER — FLUTICASONE PROPIONATE 50 MCG/ACT NA SUSP: 2.0000 | Freq: Every day | NASAL | 6 refills | Status: DC

## 2019-09-15 IMAGING — MG DIGITAL SCREENING BILATERAL MAMMOGRAM WITH TOMO AND CAD
8 series · 8 of 24 positions shown · non-contrast
Comparison: Previous exam(s).

CLINICAL DATA: Screening.

EXAM:
DIGITAL SCREENING BILATERAL MAMMOGRAM WITH TOMO AND CAD

[R CC synth-2D]
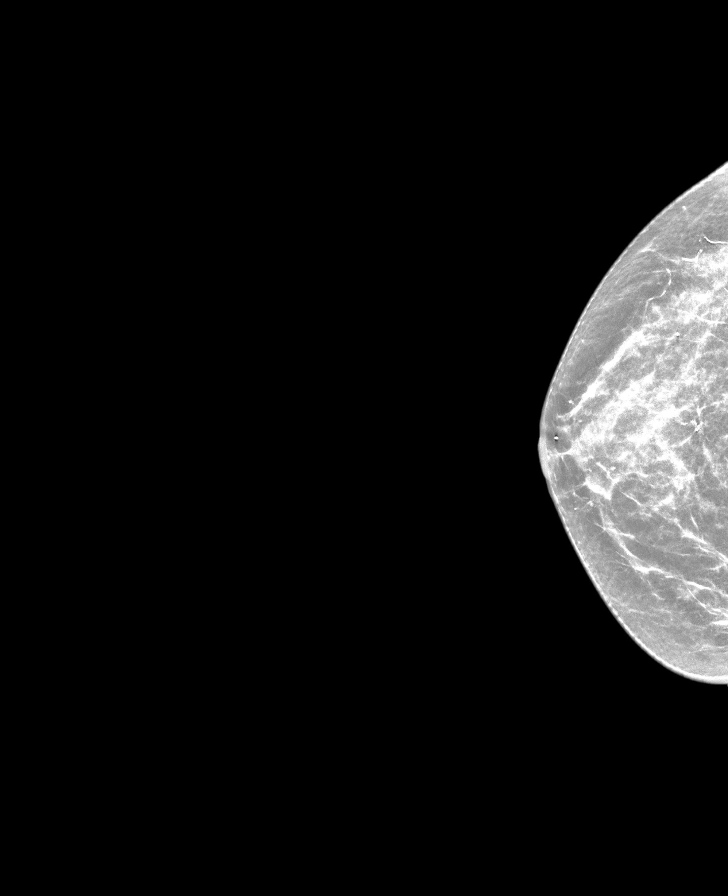

[L CC synth-2D]
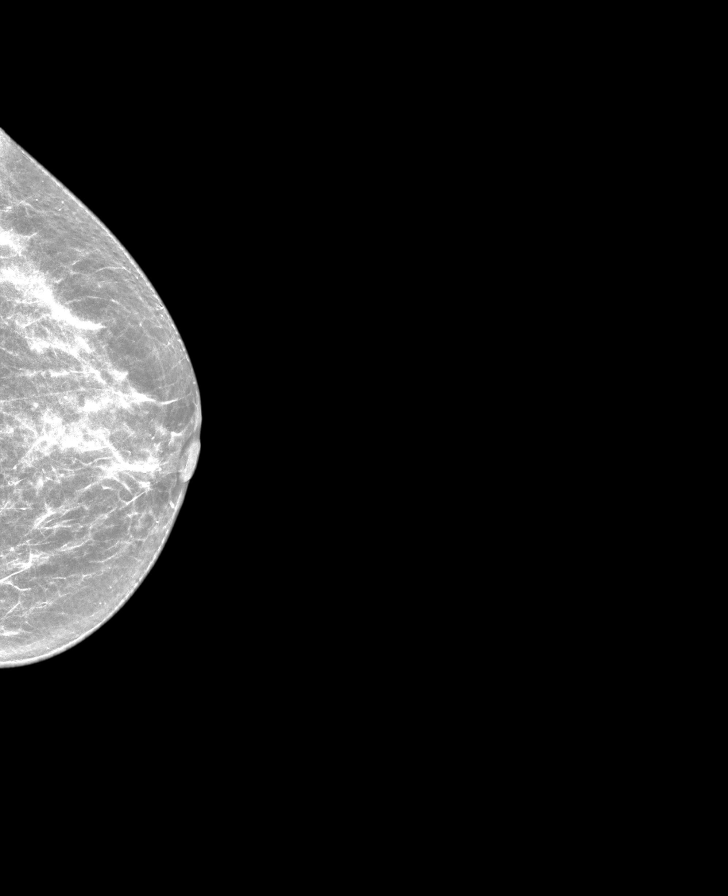

[R MLO synth-2D]
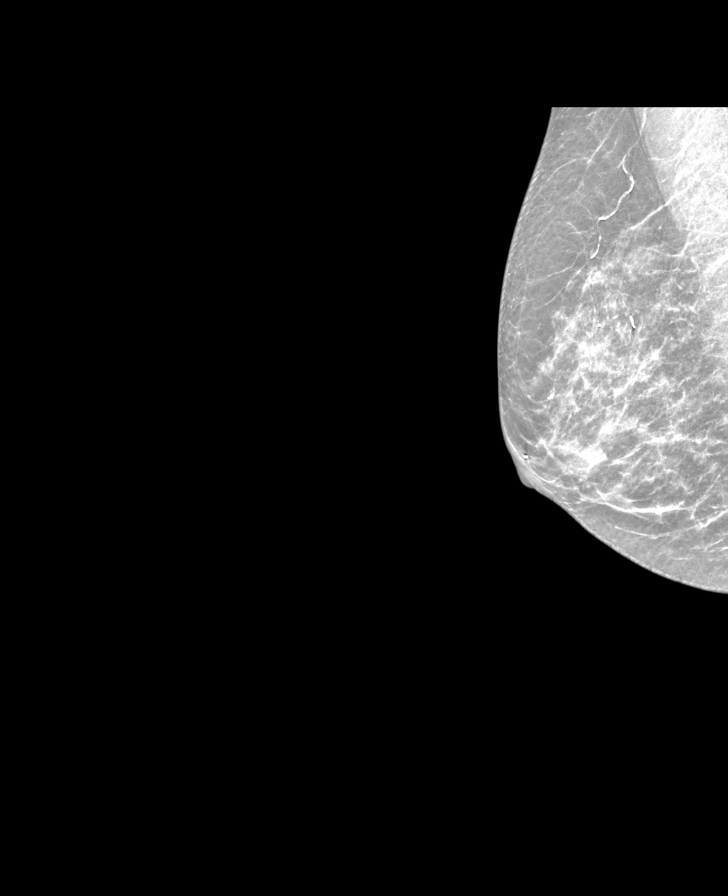

[L MLO synth-2D]
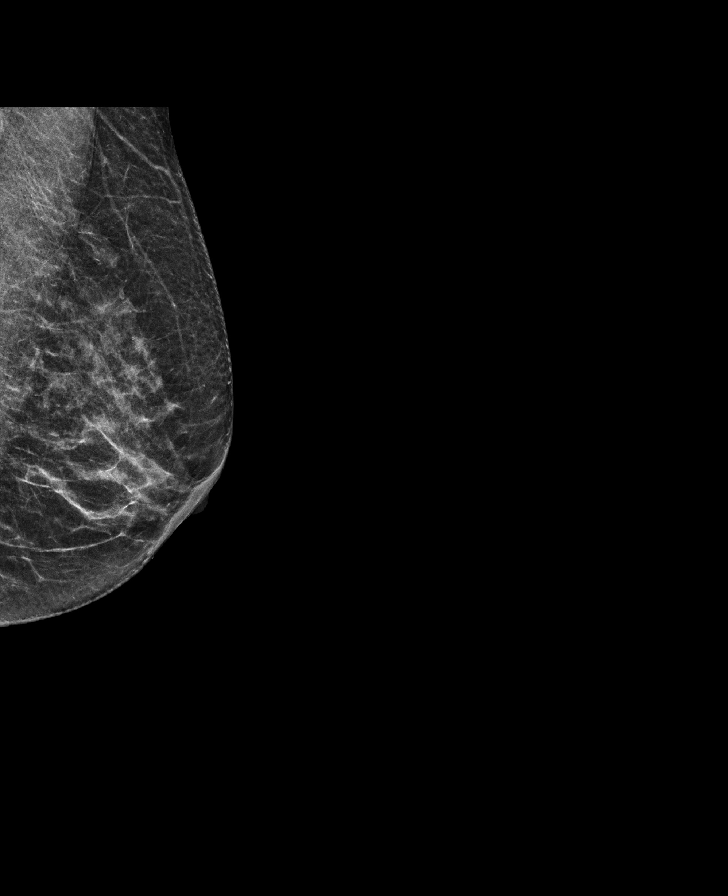

[L MLO tomo · tomo slice 24/47.0]
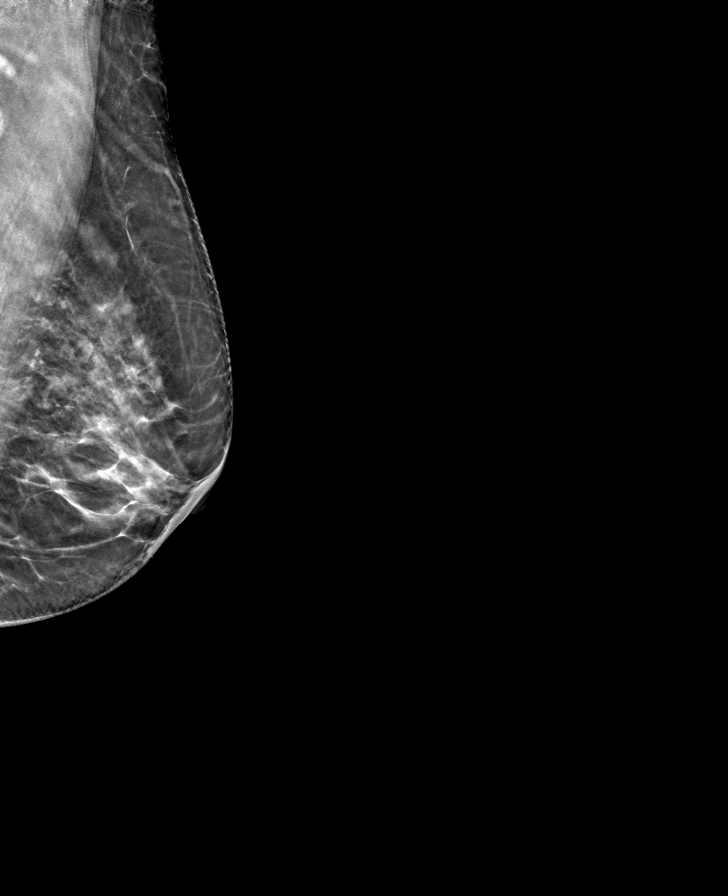

[R MLO tomo · tomo slice 23/46.0]
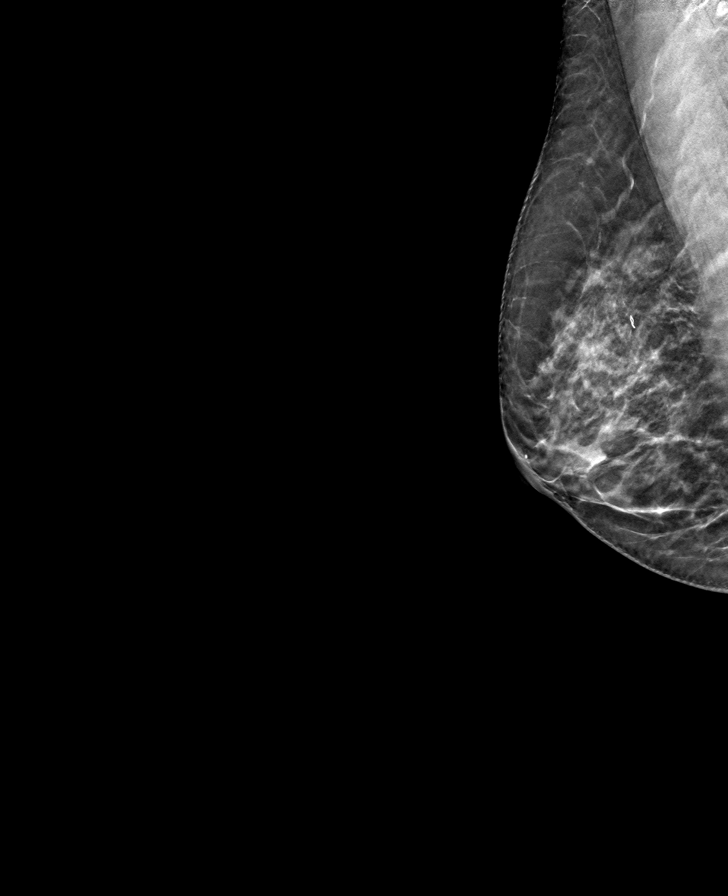

[L CC tomo · tomo slice 25/49.0]
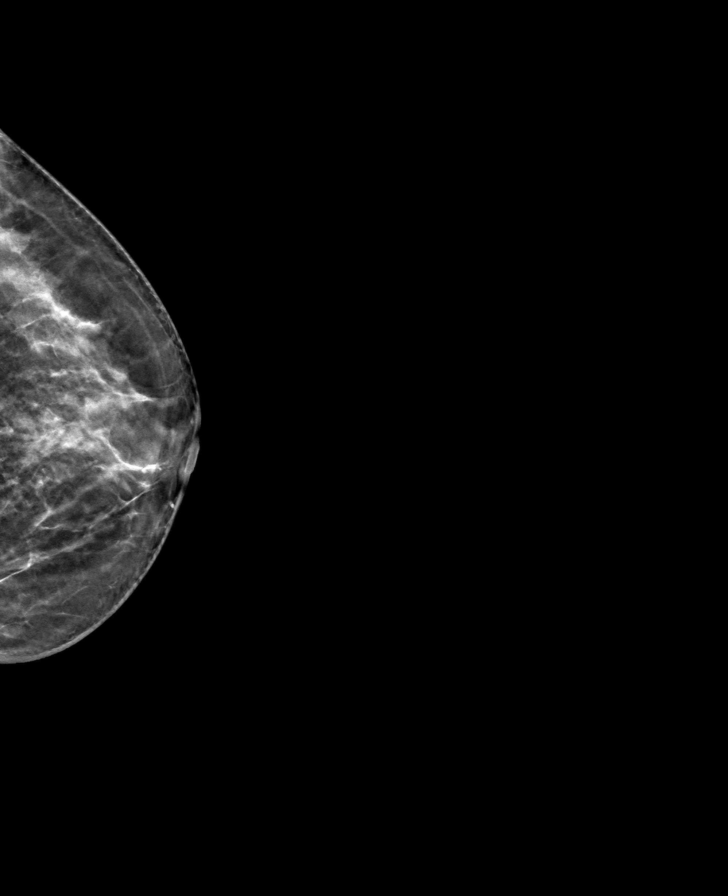

[R CC tomo · tomo slice 25/50.0]
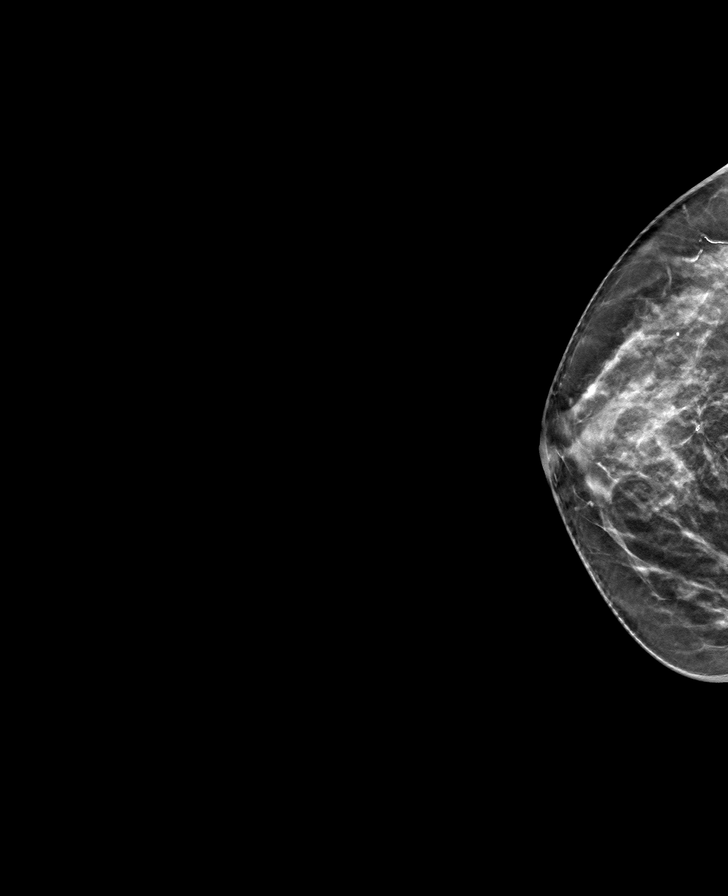

[8 of 24 positions shown; findings below may reference images not displayed]

ACR Breast Density Category c: The breast tissue is heterogeneously
dense, which may obscure small masses.
FINDINGS: There are no findings suspicious for malignancy. Images were
processed with CAD.
IMPRESSION: No mammographic evidence of malignancy. A result letter of this
screening mammogram will be mailed directly to the patient.

RECOMMENDATION:
Screening mammogram in one year. (Code:FT-U-LHB)

BI-RADS CATEGORY  1: Negative.

## 2019-10-17 ENCOUNTER — Ambulatory Visit: Payer: PPO | Admitting: Family Medicine

## 2019-10-25 ENCOUNTER — Ambulatory Visit (INDEPENDENT_AMBULATORY_CARE_PROVIDER_SITE_OTHER): Payer: PPO | Admitting: Family Medicine

## 2019-10-25 ENCOUNTER — Other Ambulatory Visit: Payer: Self-pay

## 2019-10-25 VITALS — BP 110/64 | HR 84 | Temp 97.9°F | Ht 62.0 in | Wt 117.0 lb

## 2019-10-25 DIAGNOSIS — Z780 Asymptomatic menopausal state: Secondary | ICD-10-CM

## 2019-10-25 DIAGNOSIS — L309 Dermatitis, unspecified: Secondary | ICD-10-CM | POA: Diagnosis not present

## 2019-10-25 MED ORDER — CLOBETASOL PROPIONATE 0.05 % EX CREA: 1.0000 "application " | TOPICAL_CREAM | Freq: Two times a day (BID) | CUTANEOUS | 2 refills | Status: AC

## 2019-10-25 NOTE — Progress Notes (Signed)
Subjective:    Patient ID: Taylor Shah, female    DOB: 30-May-1950, 69 y.o.   MRN: [id number]  HPI Patient has a rash on her anterior right shin that has been there for several weeks.  It is an erythematous papular rash that is 3 small erythematous bumps coalescing into a plaque roughly 1.5 cm in diameter.  It appears to be inflammation likely due to an insect bite.  I do not feel that is fungal or an infection or malignancy.  There is no scaling.  There is no warmth.  There is no tenderness or pain.  It did improve slightly with cortisone 10 cream.  Of note she is due for Prevnar 13.  She is due for mammogram.  She is due for a bone density test.  We discussed all of these today and she declines them all except for the bone density.  She would like me to schedule this Past Medical History:  Diagnosis Date  . Allergy    seasonal  . Arthritis   . Hyperlipidemia    Past Surgical History:  Procedure Laterality Date  . TUBAL LIGATION  1986   Current Outpatient Medications on File Prior to Visit  Medication Sig Dispense Refill  . cholecalciferol (VITAMIN D3) 25 MCG (1000 UT) tablet Take 1,000 Units by mouth daily.    . fluticasone (FLONASE) 50 MCG/ACT nasal spray Place 2 sprays into both nostrils daily. 16 g 6  . HYDROcodone-acetaminophen (NORCO) 5-325 MG tablet Take 1 tablet by mouth every 6 (six) hours as needed for moderate pain. 10 tablet 0  . nitrofurantoin (MACRODANTIN) 50 MG capsule TAKE 1 CAPSULE (50 MG TOTAL) BY MOUTH DAILY AS NEEDED. 30 capsule 1  . sulfamethoxazole-trimethoprim (BACTRIM DS) 800-160 MG tablet Take 1 tablet by mouth 2 (two) times daily. 14 tablet 0  . cetirizine (ZYRTEC) 10 MG tablet TAKE 1 TABLET BY MOUTH EVERY DAY (Patient not taking: Reported on 10/25/2019) 30 tablet 11  . linaclotide (LINZESS) 72 MCG capsule Take 1 capsule (72 mcg total) by mouth daily before breakfast. (Patient not taking: Reported on 10/25/2019) 30 capsule 1   No current facility-administered  medications on file prior to visit.   Allergies  Allergen Reactions  . Shellfish Allergy Hives   Social History   Socioeconomic History  . Marital status: Married    Spouse name: Not on file  . Number of children: Not on file  . Years of education: Not on file  . Highest education level: Not on file  Occupational History  . Not on file  Tobacco Use  . Smoking status: Never Smoker  . Smokeless tobacco: Never Used  Substance and Sexual Activity  . Alcohol use: Yes    Comment: Wine - occasional  . Drug use: No  . 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  Other Topics Concern  . Not on file  Social History Narrative  . Not on file   Social Determinants of Health   Financial Resource Strain:   . Difficulty of Paying Living Expenses:   Food Insecurity:   . Worried About Charity fundraiser in the Last Year:   . Arboriculturist in the Last Year:   Transportation Needs:   . Film/video editor (Medical):   Marland Kitchen Lack of Transportation (Non-Medical):   Physical Activity:   . Days of Exercise per Week:   . Minutes of Exercise per Session:   Stress:   .  Feeling of Stress :   Social Connections:   . Frequency of Communication with Friends and Family:   . Frequency of Social Gatherings with Friends and Family:   . Attends Religious Services:   . Active Member of Clubs or Organizations:   . Attends Archivist Meetings:   Marland Kitchen Marital Status:   Intimate Partner Violence:   . Fear of Current or Ex-Partner:   . Emotionally Abused:   Marland Kitchen Physically Abused:   . Sexually Abused:       Review of Systems  Skin: Positive for rash.  All other systems reviewed and are negative.      Objective:   Physical Exam Vitals reviewed.  Constitutional:      General: She is not in acute distress.    Appearance: She is well-developed. She is not diaphoretic.  Cardiovascular:     Rate and Rhythm: Normal rate and regular rhythm.     Heart  sounds: Normal heart sounds. No murmur heard.   Pulmonary:     Effort: Pulmonary effort is normal. No respiratory distress.     Breath sounds: Normal breath sounds. No wheezing or rales.  Abdominal:     Palpations: Abdomen is soft.  Musculoskeletal:     Cervical back: Neck supple.       Legs:  Skin:    Findings: Rash present. Rash is papular.           Assessment & Plan:  Postmenopausal estrogen deficiency - Plan: DG Bone Density  Dermatitis  I will schedule the patient for a bone density test.  I did recommend calcium and vitamin D supplementation.  I believe the rash is likely spongiotic dermatitis due to an insect bite.  I recommended trying clobetasol cream twice daily for 7 to 10 days and I believe it will subside.  Recommended a mammogram as well as Prevnar 13 however the patient politely defers these at the present time

## 2019-12-23 ENCOUNTER — Other Ambulatory Visit: Payer: Self-pay | Admitting: Family Medicine

## 2019-12-23 DIAGNOSIS — Z1231 Encounter for screening mammogram for malignant neoplasm of breast: Secondary | ICD-10-CM

## 2019-12-27 ENCOUNTER — Ambulatory Visit: Payer: PPO | Admitting: Family Medicine

## 2019-12-27 ENCOUNTER — Other Ambulatory Visit: Payer: Self-pay

## 2019-12-27 ENCOUNTER — Encounter: Payer: Self-pay | Admitting: Family Medicine

## 2019-12-27 ENCOUNTER — Ambulatory Visit (INDEPENDENT_AMBULATORY_CARE_PROVIDER_SITE_OTHER): Payer: PPO | Admitting: Family Medicine

## 2019-12-27 VITALS — Temp 98.0°F | Resp 14 | Ht 62.0 in | Wt 117.0 lb

## 2019-12-27 DIAGNOSIS — N644 Mastodynia: Secondary | ICD-10-CM

## 2019-12-27 DIAGNOSIS — R922 Inconclusive mammogram: Secondary | ICD-10-CM

## 2019-12-27 NOTE — Patient Instructions (Signed)
Diagnostic mammogram to be done F/U pending results

## 2019-12-27 NOTE — Progress Notes (Signed)
   Subjective:    Patient ID: Taylor Shah, female    DOB: December 09, 1950, 69 y.o.   MRN: [id number]  Patient presents for R Breast Pain (had a fewdays of pain/ tenderness to breast that has resolved. )  Pt here with right breast tenderness the past few weeks. If she presses in that area will notice pain, but it has improved the past few days. She is due for mamogram, tried to scheduled screening but due to pain, told needs diagnostic  no nipple drainage, no history  Of abnormal mammo  she does have dense breast tissue Minimal caffeine intake   No family history noted of breast cancer  Review Of Systems:  GEN- denies fatigue, fever, weight loss,weakness, recent illness HEENT- denies eye drainage, change in vision, nasal discharge, CVS- denies chest pain, palpitations RESP- denies SOB, cough, wheeze ABD- denies N/V, change in stools, abd pain Neuro- denies headache, dizziness, syncope, seizure activity       Objective:    Temp 98 F (36.7 C) (Temporal)   Resp 14   Ht 5\' 2"  (1.575 m)   Wt 117 lb (53.1 kg)   SpO2 97%   BMI 21.40 kg/m  GEN- NAD, alert and oriented x3 Neck- Supple, no thyromegaly CVS- RRR, no murmur RESP-CTAB Breast- normal symmetry, no nipple inversion,no nipple drainage, TTP right breast 9 oclock position and mild left 3 oclock, denser tissue, but no discrete mass palpated  Nodes- no axillary nodes        Assessment & Plan:      Problem List Items Addressed This Visit    None    Visit Diagnoses    Bilateral Breast pain    -  Primary   diagostic mammogram to be scheduled, discussed with pt    Relevant Orders   MM DIAG BREAST TOMO BILATERAL   US BREAST LTD UNI RIGHT INC AXILLA   US BREAST LTD UNI LEFT INC AXILLA   Dense breast tissue       Relevant Orders   MM DIAG BREAST TOMO BILATERAL   US BREAST LTD UNI RIGHT INC AXILLA   US BREAST LTD UNI LEFT INC AXILLA      Note: This dictation was prepared with Dragon dictation along with smaller Tree surgeon. Any transcriptional errors that result from this process are unintentional.

## 2019-12-29 ENCOUNTER — Encounter: Payer: Self-pay | Admitting: Family Medicine

## 2020-01-14 ENCOUNTER — Ambulatory Visit: Admission: RE | Admit: 2020-01-14 | Payer: PPO | Source: Ambulatory Visit

## 2020-01-14 ENCOUNTER — Other Ambulatory Visit: Payer: Self-pay

## 2020-01-14 ENCOUNTER — Ambulatory Visit: Payer: PPO

## 2020-01-14 ENCOUNTER — Ambulatory Visit
Admission: RE | Admit: 2020-01-14 | Discharge: 2020-01-14 | Disposition: A | Discharge status: Home / Self Care | Payer: PPO | Source: Ambulatory Visit | Attending: Family Medicine | Admitting: Family Medicine

## 2020-01-14 DIAGNOSIS — N644 Mastodynia: Secondary | ICD-10-CM | POA: Diagnosis not present

## 2020-01-14 DIAGNOSIS — R922 Inconclusive mammogram: Secondary | ICD-10-CM

## 2020-02-13 ENCOUNTER — Ambulatory Visit
Admission: RE | Admit: 2020-02-13 | Discharge: 2020-02-13 | Disposition: A | Discharge status: Home / Self Care | Payer: PPO | Source: Ambulatory Visit | Attending: Family Medicine | Admitting: Family Medicine

## 2020-02-13 ENCOUNTER — Other Ambulatory Visit: Payer: Self-pay

## 2020-02-13 ENCOUNTER — Ambulatory Visit (INDEPENDENT_AMBULATORY_CARE_PROVIDER_SITE_OTHER): Payer: PPO | Admitting: Family Medicine

## 2020-02-13 VITALS — BP 118/60 | HR 89 | Temp 98.0°F | Ht 62.0 in | Wt 118.0 lb

## 2020-02-13 DIAGNOSIS — I499 Cardiac arrhythmia, unspecified: Secondary | ICD-10-CM | POA: Diagnosis not present

## 2020-02-13 DIAGNOSIS — M5431 Sciatica, right side: Secondary | ICD-10-CM

## 2020-02-13 NOTE — Progress Notes (Signed)
Subjective:    Patient ID: Taylor Shah, female    DOB: 17-Feb-1951, 69 y.o.   MRN: [id number]    Wt Readings from Last 3 Encounters:  02/13/20 118 lb (53.5 kg)  12/27/19 117 lb (53.1 kg)  10/25/19 117 lb (53.1 kg)   Patient presents today reporting an irregular heartbeat.  Patient states that since September, she has felt her heart periodically skipping.  She describes it as a flutter in her chest.  It primarily occurs at night when she is trying to sleep.  It will take her breath away.  It starts suddenly and stop suddenly.  It usually feels just like 1 or 2 beats and then she will have a normal series of beats following it before it occurs again.  I suspect that she is having PACs and PVCs.  EKG today shows normal sinus rhythm with normal intervals and a normal axis but it does show 1 PAC.  On exam today I do hear 1 or 2 isolated PACs.  She also reports right-sided sciatica.  This is been going on since August.  Whenever she is walking she will feel pain start to radiate from the center of her right gluteus down her posterior right thigh into her mid right thigh.  She has to stretch and stop for the aching and burning to do subside.  Past Medical History:  Diagnosis Date  . Allergy    seasonal  . Arthritis   . Hyperlipidemia    Past Surgical History:  Procedure Laterality Date  . TUBAL LIGATION  1986   Current Outpatient Medications on File Prior to Visit  Medication Sig Dispense Refill  . cetirizine (ZYRTEC) 10 MG tablet TAKE 1 TABLET BY MOUTH EVERY DAY 30 tablet 11  . cholecalciferol (VITAMIN D3) 25 MCG (1000 UT) tablet Take 1,000 Units by mouth daily.    . clobetasol cream (TEMOVATE) 0.25 % Apply 1 application topically 2 (two) times daily. 30 g 2  . fluticasone (FLONASE) 50 MCG/ACT nasal spray Place 2 sprays into both nostrils daily. 16 g 6  . nitrofurantoin (MACRODANTIN) 50 MG capsule TAKE 1 CAPSULE (50 MG TOTAL) BY MOUTH DAILY AS NEEDED. 30 capsule 1   No current  facility-administered medications on file prior to visit.   Allergies  Allergen Reactions  . Shellfish Allergy Hives   Social History   Socioeconomic History  . Marital status: Married    Spouse name: Not on file  . Number of children: Not on file  . Years of education: Not on file  . Highest education level: Not on file  Occupational History  . Not on file  Tobacco Use  . Smoking status: Never Smoker  . Smokeless tobacco: Never Used  Substance and Sexual Activity  . Alcohol use: Yes    Comment: Wine - occasional  . Drug use: No  . 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  Other Topics Concern  . Not on file  Social History Narrative  . Not on file   Social Determinants of Health   Financial Resource Strain:   . Difficulty of Paying Living Expenses: Not on file  Food Insecurity:   . Worried About Charity fundraiser in the Last Year: Not on file  . Ran Out of Food in the Last Year: Not on file  Transportation Needs:   . Lack of Transportation (Medical): Not on file  . Lack of Transportation (Non-Medical): Not on file  Physical Activity:   . Days of Exercise per Week: Not on file  . Minutes of Exercise per Session: Not on file  Stress:   . Feeling of Stress : Not on file  Social Connections:   . Frequency of Communication with Friends and Family: Not on file  . Frequency of Social Gatherings with Friends and Family: Not on file  . Attends Religious Services: Not on file  . Active Member of Clubs or Organizations: Not on file  . Attends Archivist Meetings: Not on file  . Marital Status: Not on file  Intimate Partner Violence:   . Fear of Current or Ex-Partner: Not on file  . Emotionally Abused: Not on file  . Physically Abused: Not on file  . Sexually Abused: Not on file      Review of Systems  All other systems reviewed and are negative.      Objective:   Physical Exam Vitals reviewed.  Constitutional:       General: She is not in acute distress.    Appearance: She is well-developed. She is not diaphoretic.  Cardiovascular:     Rate and Rhythm: Normal rate and regular rhythm.     Heart sounds: Normal heart sounds. No murmur heard.   Pulmonary:     Effort: Pulmonary effort is normal. No respiratory distress.     Breath sounds: Normal breath sounds. No wheezing or rales.  Abdominal:     Palpations: Abdomen is soft.  Musculoskeletal:     Cervical back: Neck supple.     Lumbar back: No bony tenderness. Decreased range of motion.       Legs:           Assessment & Plan:  Irregular heart beat - Plan: EKG 12-Lead, CBC with Differential/Platelet, COMPLETE METABOLIC PANEL WITH GFR, TSH  Right sided sciatica - Plan: DG Lumbar Spine Complete  Check CBC CMP and TSH to evaluate for any metabolic derangements that could cause PACs.  Reassured the patient that I feel that she is having PACs and that this is not dangerous.  I will schedule her for a Holter monitor to verify that no other arrhythmias are occurring.  Also obtain an x-ray of the lumbar spine to evaluate for potential causes of right-sided sciatica.  Suspect that I will recommend diclofenac and stretching if the x-ray confirms degenerative disc disease.

## 2020-02-14 LAB — COMPLETE METABOLIC PANEL WITH GFR
AG Ratio: 1.9 (calc) (ref 1.0–2.5)
ALT: 15 U/L (ref 6–29)
AST: 20 U/L (ref 10–35)
Albumin: 4.3 g/dL (ref 3.6–5.1)
Alkaline phosphatase (APISO): 77 U/L (ref 37–153)
BUN: 11 mg/dL (ref 7–25)
CO2: 25 mmol/L (ref 20–32)
Calcium: 9.2 mg/dL (ref 8.6–10.4)
Chloride: 104 mmol/L (ref 98–110)
Creat: 0.75 mg/dL (ref 0.60–0.93)
GFR, Est African American: 93 mL/min/{1.73_m2} (ref >=60)
GFR, Est Non African American: 80 mL/min/{1.73_m2} (ref >=60)
Globulin: 2.3 g/dL (calc) (ref 1.9–3.7)
Glucose, Bld: 74 mg/dL (ref 65–99)
Potassium: 4.1 mmol/L (ref 3.5–5.3)
Sodium: 142 mmol/L (ref 135–146)
Total Bilirubin: 0.7 mg/dL (ref 0.2–1.2)
Total Protein: 6.6 g/dL (ref 6.1–8.1)

## 2020-02-14 LAB — CBC WITH DIFFERENTIAL/PLATELET
Absolute Monocytes: 582 cells/uL (ref 200–950)
Basophils Absolute: 22 cells/uL (ref 0–200)
Basophils Relative: 0.4 %
Eosinophils Absolute: 101 cells/uL (ref 15–500)
Eosinophils Relative: 1.8 %
HCT: 39.3 % (ref 35.0–45.0)
Hemoglobin: 12.9 g/dL (ref 11.7–15.5)
Lymphs Abs: 1938 cells/uL (ref 850–3900)
MCH: 28.9 pg (ref 27.0–33.0)
MCHC: 32.8 g/dL (ref 32.0–36.0)
MCV: 88.1 fL (ref 80.0–100.0)
MPV: 9.6 fL (ref 7.5–12.5)
Monocytes Relative: 10.4 %
Neutro Abs: 2957 cells/uL (ref 1500–7800)
Neutrophils Relative %: 52.8 %
Platelets: 256 10*3/uL (ref 140–400)
RBC: 4.46 10*6/uL (ref 3.80–5.10)
RDW: 12.8 % (ref 11.0–15.0)
Total Lymphocyte: 34.6 %
WBC: 5.6 10*3/uL (ref 3.8–10.8)

## 2020-02-14 LAB — TSH: TSH: 1.04 mIU/L (ref 0.40–4.50)

## 2020-02-28 ENCOUNTER — Other Ambulatory Visit: Payer: Self-pay

## 2020-02-28 DIAGNOSIS — M5431 Sciatica, right side: Secondary | ICD-10-CM

## 2020-03-17 DIAGNOSIS — M5431 Sciatica, right side: Secondary | ICD-10-CM | POA: Diagnosis not present

## 2020-03-17 DIAGNOSIS — M25551 Pain in right hip: Secondary | ICD-10-CM | POA: Diagnosis not present

## 2020-03-17 DIAGNOSIS — M6281 Muscle weakness (generalized): Secondary | ICD-10-CM | POA: Diagnosis not present

## 2020-03-17 DIAGNOSIS — M545 Low back pain, unspecified: Secondary | ICD-10-CM | POA: Diagnosis not present

## 2020-03-20 DIAGNOSIS — M25551 Pain in right hip: Secondary | ICD-10-CM | POA: Diagnosis not present

## 2020-03-20 DIAGNOSIS — M545 Low back pain, unspecified: Secondary | ICD-10-CM | POA: Diagnosis not present

## 2020-03-20 DIAGNOSIS — M5431 Sciatica, right side: Secondary | ICD-10-CM | POA: Diagnosis not present

## 2020-03-20 DIAGNOSIS — M6281 Muscle weakness (generalized): Secondary | ICD-10-CM | POA: Diagnosis not present

## 2020-04-02 ENCOUNTER — Ambulatory Visit (INDEPENDENT_AMBULATORY_CARE_PROVIDER_SITE_OTHER): Payer: PPO | Admitting: Family Medicine

## 2020-04-02 ENCOUNTER — Other Ambulatory Visit: Payer: Self-pay | Admitting: Family Medicine

## 2020-04-02 ENCOUNTER — Other Ambulatory Visit: Payer: Self-pay

## 2020-04-02 VITALS — BP 120/70 | HR 81 | Temp 97.8°F | Ht 62.0 in | Wt 116.0 lb

## 2020-04-02 DIAGNOSIS — R002 Palpitations: Secondary | ICD-10-CM

## 2020-04-02 DIAGNOSIS — J3489 Other specified disorders of nose and nasal sinuses: Secondary | ICD-10-CM

## 2020-04-02 MED ORDER — BACTROBAN NASAL 2 % NA OINT: 1.0000 "application " | TOPICAL_OINTMENT | Freq: Two times a day (BID) | NASAL | 0 refills | Status: DC

## 2020-04-02 MED ORDER — PREDNISONE 20 MG PO TABS: ORAL_TABLET | ORAL | 0 refills | Status: DC

## 2020-04-02 NOTE — Progress Notes (Signed)
Subjective:    Patient ID: Taylor Shah, female    DOB: [birth year], 69 y.o.   MRN: [id number]    The lesion shown above developed 3 weeks ago on the tip of her nose.  She denies any trauma.  She denies any injury.  She has not been putting anything on it.  She has not been picking at it.  Second issue is she continues to have right-sided sciatica.  X-rays were unremarkable.  She tried physical therapy on 4 separate occasions without any benefit.  She continues to report pain radiating into her right gluteus and down her right leg into her right ankle.  I suspect nerve impingement due to a bulging disc.  Third issue is the patient has never heard from cardiology about setting her up for a Holter monitor.  I have reordered this consultation today.  Please see my last office visit for further details  Past Medical History:  Diagnosis Date  . Allergy    seasonal  . Arthritis   . Hyperlipidemia    Past Surgical History:  Procedure Laterality Date  . TUBAL LIGATION  1986   Current Outpatient Medications on File Prior to Visit  Medication Sig Dispense Refill  . Ascorbic Acid (VITAMIN C) 100 MG tablet Take 500 mg by mouth daily.    . cholecalciferol (VITAMIN D3) 25 MCG (1000 UT) tablet Take 1,000 Units by mouth daily.    . clobetasol cream (TEMOVATE) 9.37 % Apply 1 application topically 2 (two) times daily. 30 g 2  . nitrofurantoin (MACRODANTIN) 50 MG capsule TAKE 1 CAPSULE (50 MG TOTAL) BY MOUTH DAILY AS NEEDED. 30 capsule 1  . cetirizine (ZYRTEC) 10 MG tablet TAKE 1 TABLET BY MOUTH EVERY DAY 30 tablet 11  . fluticasone (FLONASE) 50 MCG/ACT nasal spray Place 2 sprays into both nostrils daily. 16 g 6   No current facility-administered medications on file prior to visit.   Allergies  Allergen Reactions  . Shellfish Allergy Hives   Social History   Socioeconomic History  . Marital status: Married    Spouse name: Not on file  . Number of children: Not on file  . Years of education:  Not on file  . Highest education level: Not on file  Occupational History  . Not on file  Tobacco Use  . Smoking status: Never Smoker  . Smokeless tobacco: Never Used  Substance and Sexual Activity  . Alcohol use: Yes    Comment: Wine - occasional  . Drug use: No  . 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  Other Topics Concern  . Not on file  Social History Narrative  . Not on file   Social Determinants of Health   Financial Resource Strain: Not on file  Food Insecurity: Not on file  Transportation Needs: Not on file  Physical Activity: Not on file  Stress: Not on file  Social Connections: Not on file  Intimate Partner Violence: Not on file      Review of Systems  All other systems reviewed and are negative.      Objective:   Physical Exam Vitals reviewed.  Constitutional:      General: She is not in acute distress.    Appearance: She is well-developed. She is not diaphoretic.  HENT:     Nose:   Cardiovascular:     Rate and Rhythm: Normal rate and regular rhythm.     Heart sounds: Normal heart sounds. No  murmur heard.   Pulmonary:     Effort: Pulmonary effort is normal. No respiratory distress.     Breath sounds: Normal breath sounds. No wheezing or rales.  Abdominal:     Palpations: Abdomen is soft.  Musculoskeletal:     Cervical back: Neck supple.           Assessment & Plan:  Palpitations - Plan: Ambulatory referral to Cardiology  Lesion of nose  I believe the lesion on her nose is likely a skin cancer.  Patient would like to try using Polysporin twice daily for the next 2 weeks to see if the lesion will heal.  If not improving at that point I would recommend a trial of cryotherapy and if the lesion persist beyond that a biopsy through dermatology.  Patient continues to have palpitations and therefore I would like to have cardiology perform a Holter monitor however I suspect PACs or PVCs.  She also continues to  have right-sided sciatica.  She has tried and failed NSAIDs as well as physical therapy.  X-ray was unremarkable.  We discussed an MRI but she would like to try prednisone taper pack first.  If not improving after the prednisone taper pack I will get an MRI of the lumbar spine as she may benefit from epidural steroid injections

## 2020-04-17 ENCOUNTER — Encounter: Payer: Self-pay | Admitting: Family Medicine

## 2020-04-17 ENCOUNTER — Ambulatory Visit (INDEPENDENT_AMBULATORY_CARE_PROVIDER_SITE_OTHER): Payer: PPO | Admitting: Family Medicine

## 2020-04-17 ENCOUNTER — Other Ambulatory Visit: Payer: Self-pay

## 2020-04-17 VITALS — BP 110/62 | HR 86 | Temp 98.2°F | Ht 62.0 in | Wt 120.0 lb

## 2020-04-17 DIAGNOSIS — J3489 Other specified disorders of nose and nasal sinuses: Secondary | ICD-10-CM

## 2020-04-17 DIAGNOSIS — M5431 Sciatica, right side: Secondary | ICD-10-CM | POA: Diagnosis not present

## 2020-04-17 MED ORDER — MELOXICAM 15 MG PO TABS: 15.0000 mg | ORAL_TABLET | Freq: Every day | ORAL | 0 refills | Status: DC

## 2020-04-17 NOTE — Progress Notes (Signed)
Subjective:    Patient ID: Taylor Shah, female    DOB: Sep 25, 1950, 70 y.o.   MRN: [id number]    Patient continues to have a lesion on the tip of her nose that we discussed at her last office visit.  She would like to treat this with liquid nitrogen cryotherapy as we discussed at her last visit.  She also continues to have right-sided sciatica.  Patient has tried prednisone which helped temporarily.  She has been through 4 sessions of physical therapy which actually made the pain worse.  Past Medical History:  Diagnosis Date  . Allergy    seasonal  . Arthritis   . Hyperlipidemia    Past Surgical History:  Procedure Laterality Date  . TUBAL LIGATION  1986   Current Outpatient Medications on File Prior to Visit  Medication Sig Dispense Refill  . Ascorbic Acid (VITAMIN C) 100 MG tablet Take 500 mg by mouth daily.    . cholecalciferol (VITAMIN D3) 25 MCG (1000 UT) tablet Take 1,000 Units by mouth daily.    . clobetasol cream (TEMOVATE) 3.61 % Apply 1 application topically 2 (two) times daily. 30 g 2  . nitrofurantoin (MACRODANTIN) 50 MG capsule TAKE 1 CAPSULE (50 MG TOTAL) BY MOUTH DAILY AS NEEDED. 30 capsule 1  . cetirizine (ZYRTEC) 10 MG tablet TAKE 1 TABLET BY MOUTH EVERY DAY 30 tablet 11  . fluticasone (FLONASE) 50 MCG/ACT nasal spray Place 2 sprays into both nostrils daily. 16 g 6  . mupirocin ointment (BACTROBAN) 2 % Place into the nose 2 (two) times daily. Apply pea sized amount into B nares BID x5 days. 22 g 0  . predniSONE (DELTASONE) 20 MG tablet 3 tabs poqday 1-2, 2 tabs poqday 3-4, 1 tab poqday 5-6 12 tablet 0   No current facility-administered medications on file prior to visit.   Allergies  Allergen Reactions  . Shellfish Allergy Hives   Social History   Socioeconomic History  . Marital status: Married    Spouse name: Not on file  . Number of children: Not on file  . Years of education: Not on file  . Highest education level: Not on file  Occupational History   . Not on file  Tobacco Use  . Smoking status: Never Smoker  . Smokeless tobacco: Never Used  Substance and Sexual Activity  . Alcohol use: Yes    Comment: Wine - occasional  . Drug use: No  . 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  Other Topics Concern  . Not on file  Social History Narrative  . Not on file   Social Determinants of Health   Financial Resource Strain: Not on file  Food Insecurity: Not on file  Transportation Needs: Not on file  Physical Activity: Not on file  Stress: Not on file  Social Connections: Not on file  Intimate Partner Violence: Not on file      Review of Systems  All other systems reviewed and are negative.      Objective:   Physical Exam Vitals reviewed.  Constitutional:      General: She is not in acute distress.    Appearance: She is well-developed. She is not diaphoretic.  HENT:     Nose:   Cardiovascular:     Rate and Rhythm: Normal rate and regular rhythm.     Heart sounds: Normal heart sounds. No murmur heard.   Pulmonary:     Effort: Pulmonary effort is  normal. No respiratory distress.     Breath sounds: Normal breath sounds. No wheezing or rales.  Abdominal:     Palpations: Abdomen is soft.  Musculoskeletal:     Cervical back: Neck supple.           Assessment & Plan:  Lesion of nose  Right sided sciatica  I treated the lesion on the tip of her nose with liquid nitrogen cryotherapy for a total of 30 seconds.  Follow-up in 4 weeks if still persistent.  I will start her on meloxicam 15 mg a day for her right-sided sciatica.  If this is not improving, I would recommend an MRI to evaluate the cause of her sciatica and potentially cortisone injections.  She will call me back if she decides to pursue that pathway

## 2020-04-22 ENCOUNTER — Ambulatory Visit: Payer: PPO | Admitting: Cardiovascular Disease

## 2020-06-02 ENCOUNTER — Ambulatory Visit (INDEPENDENT_AMBULATORY_CARE_PROVIDER_SITE_OTHER): Payer: PPO | Admitting: Family Medicine

## 2020-06-02 ENCOUNTER — Other Ambulatory Visit: Payer: Self-pay

## 2020-06-02 ENCOUNTER — Encounter: Payer: Self-pay | Admitting: Family Medicine

## 2020-06-02 VITALS — BP 110/68 | HR 83 | Temp 97.9°F | Ht 62.0 in | Wt 121.0 lb

## 2020-06-02 DIAGNOSIS — J3489 Other specified disorders of nose and nasal sinuses: Secondary | ICD-10-CM

## 2020-06-02 NOTE — Progress Notes (Signed)
Subjective:    Patient ID: Taylor Shah, female    DOB: [birth year], 70 y.o.   MRN: [id number]   04/17/20    Patient continues to have a lesion on the tip of her nose that we discussed at her last office visit.  She would like to treat this with liquid nitrogen cryotherapy as we discussed at her last visit.  She also continues to have right-sided sciatica.  Patient has tried prednisone which helped temporarily.  She has been through 4 sessions of physical therapy which actually made the pain worse.  At that time, my plan was: I treated the lesion on the tip of her nose with liquid nitrogen cryotherapy for a total of 30 seconds.  Follow-up in 4 weeks if still persistent.  I will start her on meloxicam 15 mg a day for her right-sided sciatica.  If this is not improving, I would recommend an MRI to evaluate the cause of her sciatica and potentially cortisone injections.  She will call me back if she decides to pursue that pathway  06/02/20   Patient is a very sweet 70 year old Caucasian female here today for recheck.  She was concerned because the lesion on her nose was still hyperpigmented.  However compared to the previous image, the skin surface is now smooth and even.  There is no ulceration.  You cannot palpate the lesion.  There is no roughness to the skin.  The lesion is not raised.  It is not painful.  It does not itch.  I believe what we are seeing is residual hyperpigmentation due to the scarring caused by the cryotherapy rather than any malignancy.  Past Medical History:  Diagnosis Date  . Allergy    seasonal  . Arthritis   . Hyperlipidemia    Past Surgical History:  Procedure Laterality Date  . TUBAL LIGATION  1986   Current Outpatient Medications on File Prior to Visit  Medication Sig Dispense Refill  . Ascorbic Acid (VITAMIN C) 100 MG tablet Take 500 mg by mouth daily.    . cetirizine (ZYRTEC) 10 MG tablet TAKE 1 TABLET BY MOUTH EVERY DAY 30 tablet 11  . cholecalciferol  (VITAMIN D3) 25 MCG (1000 UT) tablet Take 1,000 Units by mouth daily.    . clobetasol cream (TEMOVATE) 4.49 % Apply 1 application topically 2 (two) times daily. 30 g 2  . fluticasone (FLONASE) 50 MCG/ACT nasal spray Place 2 sprays into both nostrils daily. 16 g 6  . meloxicam (MOBIC) 15 MG tablet Take 1 tablet (15 mg total) by mouth daily. 30 tablet 0  . mupirocin ointment (BACTROBAN) 2 % Place into the nose 2 (two) times daily. Apply pea sized amount into B nares BID x5 days. 22 g 0  . nitrofurantoin (MACRODANTIN) 50 MG capsule TAKE 1 CAPSULE (50 MG TOTAL) BY MOUTH DAILY AS NEEDED. 30 capsule 1  . predniSONE (DELTASONE) 20 MG tablet 3 tabs poqday 1-2, 2 tabs poqday 3-4, 1 tab poqday 5-6 12 tablet 0   No current facility-administered medications on file prior to visit.   Allergies  Allergen Reactions  . Shellfish Allergy Hives   Social History   Socioeconomic History  . Marital status: Married    Spouse name: Not on file  . Number of children: Not on file  . Years of education: Not on file  . Highest education level: Not on file  Occupational History  . Not on file  Tobacco Use  . Smoking status: Never Smoker  .  Smokeless tobacco: Never Used  Substance and Sexual Activity  . Alcohol use: Yes    Comment: Wine - occasional  . Drug use: No  . 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  Other Topics Concern  . Not on file  Social History Narrative  . Not on file   Social Determinants of Health   Financial Resource Strain: Not on file  Food Insecurity: Not on file  Transportation Needs: Not on file  Physical Activity: Not on file  Stress: Not on file  Social Connections: Not on file  Intimate Partner Violence: Not on file      Review of Systems  All other systems reviewed and are negative.      Objective:   Physical Exam Vitals reviewed.  Constitutional:      General: She is not in acute distress.    Appearance: She is  well-developed. She is not diaphoretic.  HENT:     Nose:   Cardiovascular:     Rate and Rhythm: Normal rate and regular rhythm.     Heart sounds: Normal heart sounds. No murmur heard.   Pulmonary:     Effort: Pulmonary effort is normal. No respiratory distress.     Breath sounds: Normal breath sounds. No wheezing or rales.  Abdominal:     Palpations: Abdomen is soft.  Musculoskeletal:     Cervical back: Neck supple.           Assessment & Plan:  Lesion of nose  I believe the picture above shows hyperpigmentation due to scarring after cryotherapy rather than any residual malignancy.  I have recommended that we simply monitor the area moving forward.  If it begins to grow or becomes raised or rub or painful or ulcerated I would recommend a biopsy.  The other option would be to see a dermatologist for a second opinion or get a biopsy now however I believe that the biopsy will cause more scarring and would likely be normal.  Therefore I have recommended that we simply monitor the area for any change.  Patient is comfortable with that plan.  I did try to convince her to get the Covid booster.

## 2020-06-29 ENCOUNTER — Encounter: Payer: Self-pay | Admitting: Family Medicine

## 2020-06-29 ENCOUNTER — Other Ambulatory Visit: Payer: Self-pay

## 2020-06-29 ENCOUNTER — Ambulatory Visit (INDEPENDENT_AMBULATORY_CARE_PROVIDER_SITE_OTHER): Payer: PPO | Admitting: Family Medicine

## 2020-06-29 VITALS — BP 112/64 | HR 80 | Temp 98.1°F | Resp 14 | Ht 62.0 in | Wt 121.0 lb

## 2020-06-29 DIAGNOSIS — M5431 Sciatica, right side: Secondary | ICD-10-CM

## 2020-06-29 NOTE — Progress Notes (Signed)
Subjective:    Patient ID: Taylor Shah, female    DOB: 1950-06-04, 70 y.o.   MRN: [id number]    I originally saw the patient in November and then later in December for right-sided sciatica.  She continues to have the pain that radiates into her right gluteus and down her right leg.  She has tried prednisone for this.  She is tried arthritis medication.  She is even gone through 4 sessions of physical therapy and has not seen any benefit.  X-rays of the lumbar spine showed mild degenerative changes but were otherwise normal.  She continues to have sciatica type neuropathic pain radiate down her right leg.  She also reports paresthesias in her right foot.  She states that her right foot particular the lateral aspect of her foot always feels ice cold.  However when she touches it with her fingers it is warm to the touch.  She has excellent circulation in her posterior tibialis and dorsalis pedis on the right foot with normal capillary refill.  Therefore I think that these are paresthesias that she is experiencing in dysesthesias in the feet due to her right-sided sciatica Past Medical History:  Diagnosis Date  . Allergy    seasonal  . Arthritis   . Hyperlipidemia    Past Surgical History:  Procedure Laterality Date  . TUBAL LIGATION  1986   Current Outpatient Medications on File Prior to Visit  Medication Sig Dispense Refill  . Ascorbic Acid (VITAMIN C) 100 MG tablet Take 500 mg by mouth daily.    . cholecalciferol (VITAMIN D3) 25 MCG (1000 UT) tablet Take 1,000 Units by mouth daily.    . clobetasol cream (TEMOVATE) 0.25 % Apply 1 application topically 2 (two) times daily. 30 g 2  . diphenhydrAMINE HCl (ALLERGY MED PO) Take by mouth.    . fluticasone (FLONASE) 50 MCG/ACT nasal spray Place 2 sprays into both nostrils daily.    . nitrofurantoin (MACRODANTIN) 50 MG capsule TAKE 1 CAPSULE (50 MG TOTAL) BY MOUTH DAILY AS NEEDED. 30 capsule 1   No current facility-administered medications on file  prior to visit.   Allergies  Allergen Reactions  . Shellfish Allergy Hives   Social History   Socioeconomic History  . Marital status: Married    Spouse name: Not on file  . Number of children: Not on file  . Years of education: Not on file  . Highest education level: Not on file  Occupational History  . Not on file  Tobacco Use  . Smoking status: Never Smoker  . Smokeless tobacco: Never Used  Substance and Sexual Activity  . Alcohol use: Yes    Comment: Wine - occasional  . Drug use: No  . 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  Other Topics Concern  . Not on file  Social History Narrative  . Not on file   Social Determinants of Health   Financial Resource Strain: Not on file  Food Insecurity: Not on file  Transportation Needs: Not on file  Physical Activity: Not on file  Stress: Not on file  Social Connections: Not on file  Intimate Partner Violence: Not on file      Review of Systems  All other systems reviewed and are negative.      Objective:   Physical Exam Vitals reviewed.  Constitutional:      General: She is not in acute distress.    Appearance: She is well-developed. She  is not diaphoretic.  Cardiovascular:     Rate and Rhythm: Normal rate and regular rhythm.     Heart sounds: Normal heart sounds. No murmur heard.   Pulmonary:     Effort: Pulmonary effort is normal. No respiratory distress.     Breath sounds: Normal breath sounds. No wheezing or rales.  Abdominal:     Palpations: Abdomen is soft.  Musculoskeletal:     Cervical back: Neck supple.     Lumbar back: No bony tenderness. Decreased range of motion.       Legs:           Assessment & Plan:  Right sided sciatica - Plan: MR Lumbar Spine Wo Contrast  Proceed with an MRI of the lumbar spine.  Patient has tried physical therapy, prednisone, NSAIDs, and has had the symptoms present now for almost 6 months.  She has failed conservative  therapy.  Therefore I believe an MRI is warranted.  She would be interested in epidural steroid injections if there is in fact found to be nerve impingement in the lumbar spine

## 2020-07-13 ENCOUNTER — Ambulatory Visit
Admission: RE | Admit: 2020-07-13 | Discharge: 2020-07-13 | Disposition: A | Discharge status: Home / Self Care | Payer: PPO | Source: Ambulatory Visit | Attending: Family Medicine | Admitting: Family Medicine

## 2020-07-13 ENCOUNTER — Other Ambulatory Visit: Payer: Self-pay

## 2020-07-13 DIAGNOSIS — M5431 Sciatica, right side: Secondary | ICD-10-CM

## 2020-07-13 DIAGNOSIS — M5127 Other intervertebral disc displacement, lumbosacral region: Secondary | ICD-10-CM | POA: Diagnosis not present

## 2020-07-13 DIAGNOSIS — M5126 Other intervertebral disc displacement, lumbar region: Secondary | ICD-10-CM | POA: Diagnosis not present

## 2020-07-13 DIAGNOSIS — M47816 Spondylosis without myelopathy or radiculopathy, lumbar region: Secondary | ICD-10-CM | POA: Diagnosis not present

## 2020-07-13 DIAGNOSIS — M47817 Spondylosis without myelopathy or radiculopathy, lumbosacral region: Secondary | ICD-10-CM | POA: Diagnosis not present

## 2020-12-30 ENCOUNTER — Telehealth: Payer: Self-pay

## 2020-12-30 NOTE — Progress Notes (Addendum)
Left message for patient to call back and schedule Medicare Annual Wellness Visit (AWV)    Last AWV  01/10/2015  Please schedule with [facility]-Nurse Health Advisor.       60 Minutes appointment    Any questions, please call me at   [address], [address], [place], [place] [address] Direct Dial: [phone] [email or web address]

## 2021-01-14 ENCOUNTER — Encounter: Payer: Self-pay | Admitting: Family Medicine

## 2021-04-06 ENCOUNTER — Telehealth: Payer: Self-pay | Admitting: Family Medicine

## 2021-04-06 NOTE — Telephone Encounter (Signed)
Left message for patient to call back and schedule Medicare Annual Wellness Visit (AWV) in office.  ° °If not able to come in office, please offer to do virtually or by telephone.  Left office number and my jabber #[phone]. ° °Due for AWVI ° °Please schedule at anytime with Nurse Health Advisor. °  °

## 2021-04-07 ENCOUNTER — Telehealth: Payer: Self-pay | Admitting: Family Medicine

## 2021-04-07 NOTE — Telephone Encounter (Signed)
Left message for patient to call back and schedule Medicare Annual Wellness Visit (AWV) in office.  ° °If not able to come in office, please offer to do virtually or by telephone.  Left office number and my jabber #[phone]. ° °Due for AWVI ° °Please schedule at anytime with Nurse Health Advisor. °  °

## 2021-05-03 ENCOUNTER — Telehealth: Payer: Self-pay | Admitting: Family Medicine

## 2021-05-03 NOTE — Telephone Encounter (Signed)
Left message for patient to call back and schedule Medicare Annual Wellness Visit (AWV) in office.  ° °If not able to come in office, please offer to do virtually or by telephone.  Left office number and my jabber #[phone]. ° °Due for AWVI ° °Please schedule at anytime with Nurse Health Advisor. °  °

## 2021-05-19 ENCOUNTER — Telehealth: Payer: Self-pay | Admitting: Family Medicine

## 2021-05-19 NOTE — Telephone Encounter (Signed)
Left message for patient to call back and schedule Medicare Annual Wellness Visit (AWV) in office.  ° °If not able to come in office, please offer to do virtually or by telephone.  Left office number and my jabber #[phone]. ° °Due for AWVI ° °Please schedule at anytime with Nurse Health Advisor. °  °

## 2021-06-03 ENCOUNTER — Telehealth: Payer: Self-pay | Admitting: Family Medicine

## 2021-06-03 NOTE — Telephone Encounter (Signed)
Left message for patient to call back and schedule Medicare Annual Wellness Visit (AWV) in office.  ° °If not able to come in office, please offer to do virtually or by telephone.  Left office number and my jabber #[phone]. ° °Due for AWVI ° °Please schedule at anytime with Nurse Health Advisor. °  °

## 2021-06-21 ENCOUNTER — Telehealth: Payer: Self-pay | Admitting: Family Medicine

## 2021-06-21 NOTE — Telephone Encounter (Signed)
Left message for patient to call back and schedule Medicare Annual Wellness Visit (AWV) in office.  ? ?If not able to come in office, please offer to do virtually or by telephone.  Left office number and my jabber [phone]. ? ?AWVI eligible as of 01/10/15 ? ?Please schedule at anytime with Nurse Health Advisor. ?  ?

## 2021-07-14 ENCOUNTER — Telehealth: Payer: Self-pay | Admitting: Family Medicine

## 2021-07-14 NOTE — Telephone Encounter (Signed)
Left message for patient to call back and schedule Medicare Annual Wellness Visit (AWV) in office.  ° °If not able to come in office, please offer to do virtually or by telephone.  Left office number and my jabber #[phone]. ° °Due for AWVI ° °Please schedule at anytime with Nurse Health Advisor. °  °

## 2021-08-04 ENCOUNTER — Telehealth: Payer: Self-pay | Admitting: Family Medicine

## 2021-08-04 NOTE — Telephone Encounter (Signed)
Left message for patient to call back and schedule Medicare Annual Wellness Visit (AWV). Please offer to do virtually or by telephone.  Left office number and my jabber #[phone]. ? ?Due for AWVI ? ?Please schedule at anytime with Nurse Health Advisor. ?  ?

## 2021-08-25 DIAGNOSIS — H52203 Unspecified astigmatism, bilateral: Secondary | ICD-10-CM | POA: Diagnosis not present

## 2021-08-25 DIAGNOSIS — Z961 Presence of intraocular lens: Secondary | ICD-10-CM | POA: Diagnosis not present

## 2021-08-25 DIAGNOSIS — H18593 Other hereditary corneal dystrophies, bilateral: Secondary | ICD-10-CM | POA: Diagnosis not present

## 2021-09-08 ENCOUNTER — Telehealth: Payer: Self-pay | Admitting: Family Medicine

## 2021-09-08 NOTE — Telephone Encounter (Signed)
Left message for patient to call back and schedule Medicare Annual Wellness Visit (AWV) in office.   If not able to come in office, please offer to do virtually or by telephone.  Left office number and my jabber [phone].  AWVI  eligible as of 01/10/15  Please schedule at anytime with Nurse Health Advisor.

## 2021-11-30 ENCOUNTER — Other Ambulatory Visit: Payer: Self-pay

## 2021-11-30 ENCOUNTER — Ambulatory Visit
Admission: RE | Admit: 2021-11-30 | Discharge: 2021-11-30 | Disposition: A | Discharge status: Home / Self Care | Payer: PPO | Source: Ambulatory Visit | Attending: Family Medicine | Admitting: Family Medicine

## 2021-11-30 ENCOUNTER — Ambulatory Visit (INDEPENDENT_AMBULATORY_CARE_PROVIDER_SITE_OTHER): Payer: PPO | Admitting: Family Medicine

## 2021-11-30 VITALS — BP 118/62 | HR 80 | Temp 98.6°F | Ht 62.0 in | Wt 120.8 lb

## 2021-11-30 DIAGNOSIS — M25532 Pain in left wrist: Secondary | ICD-10-CM | POA: Diagnosis not present

## 2021-11-30 DIAGNOSIS — M19032 Primary osteoarthritis, left wrist: Secondary | ICD-10-CM | POA: Diagnosis not present

## 2021-11-30 DIAGNOSIS — G8929 Other chronic pain: Secondary | ICD-10-CM | POA: Diagnosis not present

## 2021-11-30 DIAGNOSIS — M1812 Unilateral primary osteoarthritis of first carpometacarpal joint, left hand: Secondary | ICD-10-CM | POA: Diagnosis not present

## 2021-11-30 MED ORDER — MELOXICAM 15 MG PO TABS: 15.0000 mg | ORAL_TABLET | Freq: Every day | ORAL | 1 refills | Status: DC

## 2021-11-30 MED ORDER — MOLNUPIRAVIR EUA 200MG CAPSULE: 4.0000 | ORAL_CAPSULE | Freq: Two times a day (BID) | ORAL | 0 refills | Status: DC

## 2021-11-30 NOTE — Progress Notes (Signed)
Subjective:    Patient ID: Taylor Shah, female    DOB: 09-Oct-1950, 71 y.o.   MRN: [id number]   Patient presents with left wrist pain.  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  She reports pain with flexion and extension.  The pain is over the distal radius and the proximal carpal row.  She has pain with flexion and extension and medial or lateral deviation.  There is no erythema.  There is no edema.  There is no fever.  She has tried a wrist splint but no anti-inflammatories.  She denies any falls or injuries.  She has a negative Tinel's sign.  She has a negative Phalen sign. Past Medical History:  Diagnosis Date   Allergy    seasonal   Arthritis    Hyperlipidemia    Past Surgical History:  Procedure Laterality Date   TUBAL LIGATION  1986   Current Outpatient Medications on File Prior to Visit  Medication Sig Dispense Refill   Ascorbic Acid (VITAMIN C) 100 MG tablet Take 500 mg by mouth daily.     cholecalciferol (VITAMIN D3) 25 MCG (1000 UT) tablet Take 1,000 Units by mouth daily.     clobetasol cream (TEMOVATE) 6.30 % Apply 1 application topically 2 (two) times daily. 30 g 2   fluticasone (FLONASE) 50 MCG/ACT nasal spray Place 2 sprays into both nostrils daily.     nitrofurantoin (MACRODANTIN) 50 MG capsule TAKE 1 CAPSULE (50 MG TOTAL) BY MOUTH DAILY AS NEEDED. 30 capsule 1   diphenhydrAMINE HCl (ALLERGY MED PO) Take by mouth. (Patient not taking: Reported on 11/30/2021)     No current facility-administered medications on file prior to visit.   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  Other Topics Concern   Not on file  Social History Narrative   Not on file   Social Determinants of Health   Financial Resource Strain: Not on file  Food Insecurity: Not on file  Transportation Needs: Not on file  Physical Activity: Not on file  Stress: Not on file  Social Connections: Not on file  Intimate Partner Violence: Not on file      Review of Systems  All other systems reviewed and are negative.      Objective:   Physical Exam Vitals reviewed.  Constitutional:      General: She is not in acute distress.    Appearance: She is well-developed. She is not diaphoretic.  Cardiovascular:     Rate and Rhythm: Normal rate and regular rhythm.     Heart sounds: Normal heart sounds. No murmur heard. Pulmonary:     Effort: Pulmonary effort is normal. No respiratory distress.     Breath sounds: Normal breath sounds. No wheezing or rales.  Abdominal:     Palpations: Abdomen is soft.  Musculoskeletal:  Left wrist: Tenderness present. No swelling, deformity, effusion, lacerations, bony tenderness, snuff box tenderness or crepitus. Normal range of motion.     Cervical back: Neck supple.     Lumbar back: No bony tenderness. Decreased range of motion.           Assessment & Plan:  Left wrist pain - Plan: DG Wrist Complete Left I suspect arthritis in the wrist.  Recommended a wrist splint daily for 2 weeks to immobilize the joint and then start meloxicam 15 mg daily and recheck in 2 weeks.  Obtain x-ray of the wrist to evaluate further.

## 2022-03-02 ENCOUNTER — Telehealth: Payer: Self-pay | Admitting: Family Medicine

## 2022-03-02 NOTE — Telephone Encounter (Signed)
Left message for patient to call back and schedule Medicare Annual Wellness Visit (AWV) in office.   If not able to come in office, please offer to do virtually or by telephone.   No history of  AWV: Per [facility] eligible for AWVI as of 01/10/2015  Please schedule at anytime with [facility]  If any questions, please contact me at [phone]

## 2022-03-30 ENCOUNTER — Encounter: Payer: Self-pay | Admitting: Family Medicine

## 2022-03-31 ENCOUNTER — Other Ambulatory Visit: Payer: Self-pay | Admitting: Family Medicine

## 2022-03-31 MED ORDER — NIRMATRELVIR/RITONAVIR (PAXLOVID)TABLET: 3.0000 | ORAL_TABLET | Freq: Two times a day (BID) | ORAL | 0 refills | Status: AC

## 2022-05-03 ENCOUNTER — Other Ambulatory Visit: Payer: Self-pay | Admitting: Family Medicine

## 2022-05-03 DIAGNOSIS — Z1231 Encounter for screening mammogram for malignant neoplasm of breast: Secondary | ICD-10-CM

## 2022-07-26 ENCOUNTER — Other Ambulatory Visit: Payer: Self-pay

## 2022-07-26 ENCOUNTER — Encounter: Payer: Self-pay | Admitting: Family Medicine

## 2022-07-26 DIAGNOSIS — J302 Other seasonal allergic rhinitis: Secondary | ICD-10-CM

## 2022-07-26 MED ORDER — CETIRIZINE HCL 10 MG PO TABS: 10.0000 mg | ORAL_TABLET | Freq: Every day | ORAL | 3 refills | Status: AC

## 2022-07-26 MED ORDER — FLUTICASONE PROPIONATE 50 MCG/ACT NA SUSP: 2.0000 | Freq: Every day | NASAL | 1 refills | Status: AC

## 2022-08-15 ENCOUNTER — Other Ambulatory Visit: Payer: Self-pay

## 2022-08-15 ENCOUNTER — Encounter: Payer: Self-pay | Admitting: Family Medicine

## 2022-08-15 DIAGNOSIS — N3090 Cystitis, unspecified without hematuria: Secondary | ICD-10-CM

## 2022-08-15 DIAGNOSIS — J302 Other seasonal allergic rhinitis: Secondary | ICD-10-CM

## 2022-08-15 MED ORDER — NITROFURANTOIN MACROCRYSTAL 50 MG PO CAPS: 50.0000 mg | ORAL_CAPSULE | Freq: Every day | ORAL | 3 refills | Status: DC | PRN

## 2022-09-07 DIAGNOSIS — H52203 Unspecified astigmatism, bilateral: Secondary | ICD-10-CM | POA: Diagnosis not present

## 2022-09-07 DIAGNOSIS — H18593 Other hereditary corneal dystrophies, bilateral: Secondary | ICD-10-CM | POA: Diagnosis not present

## 2022-09-07 DIAGNOSIS — Z961 Presence of intraocular lens: Secondary | ICD-10-CM | POA: Diagnosis not present

## 2022-12-03 DIAGNOSIS — K30 Functional dyspepsia: Secondary | ICD-10-CM | POA: Insufficient documentation

## 2023-02-20 ENCOUNTER — Encounter: Payer: Self-pay | Admitting: Internal Medicine

## 2023-09-01 ENCOUNTER — Encounter: Payer: Self-pay | Admitting: Family Medicine

## 2023-09-01 ENCOUNTER — Other Ambulatory Visit: Payer: Self-pay

## 2023-09-01 DIAGNOSIS — L0100 Impetigo, unspecified: Secondary | ICD-10-CM

## 2023-09-01 MED ORDER — SULFAMETHOXAZOLE-TRIMETHOPRIM 800-160 MG PO TABS: 1.0000 | ORAL_TABLET | Freq: Two times a day (BID) | ORAL | 0 refills | Status: DC

## 2023-09-08 DIAGNOSIS — Z961 Presence of intraocular lens: Secondary | ICD-10-CM | POA: Diagnosis not present

## 2023-09-08 DIAGNOSIS — H52203 Unspecified astigmatism, bilateral: Secondary | ICD-10-CM | POA: Diagnosis not present

## 2023-09-08 DIAGNOSIS — H1789 Other corneal scars and opacities: Secondary | ICD-10-CM | POA: Diagnosis not present

## 2023-11-01 ENCOUNTER — Encounter: Payer: Self-pay | Admitting: Family Medicine

## 2023-11-02 ENCOUNTER — Other Ambulatory Visit: Payer: Self-pay

## 2023-11-02 DIAGNOSIS — R198 Other specified symptoms and signs involving the digestive system and abdomen: Secondary | ICD-10-CM

## 2023-11-23 ENCOUNTER — Ambulatory Visit (INDEPENDENT_AMBULATORY_CARE_PROVIDER_SITE_OTHER): Admitting: Family Medicine

## 2023-11-23 ENCOUNTER — Encounter: Payer: Self-pay | Admitting: Family Medicine

## 2023-11-23 VITALS — BP 120/72 | HR 81 | Temp 98.6°F | Ht 62.0 in | Wt 112.4 lb

## 2023-11-23 DIAGNOSIS — R1084 Generalized abdominal pain: Secondary | ICD-10-CM

## 2023-11-23 DIAGNOSIS — K589 Irritable bowel syndrome without diarrhea: Secondary | ICD-10-CM

## 2023-11-23 DIAGNOSIS — Z1211 Encounter for screening for malignant neoplasm of colon: Secondary | ICD-10-CM

## 2023-11-23 MED ORDER — METRONIDAZOLE 500 MG PO TABS: 500.0000 mg | ORAL_TABLET | Freq: Three times a day (TID) | ORAL | 0 refills | Status: AC

## 2023-11-23 NOTE — Progress Notes (Signed)
 Subjective:    Patient ID: Taylor Shah, female    DOB: [birth year], 73 y.o.   MRN: [id number]  Patient has been dealing with abdominal pain for several months.  She states that 2-3 times a month she will go to the bathroom to defecate.  She will instantly feel intense muscle cramps in her lower abdomen.  This can last up to 20 minutes and leave her feeling extremely wiped out for the rest of the day.  She denies any blood in her stool or melena.  She denies any constipation.  She states that she goes to the bathroom every day and that the stool is formed and regular.  She denies any fevers or chills or nausea or vomiting however she does report excessive bloating and gas on a daily basis.  She has attributed all of this to IBS.  She has tried restricting her diet.  She is following a FODMAP diet.  This has helped her symptoms some however she continues to have the excessive bloating and gas.  She has not had any intestinal pain for 1 month.  She denies any diarrhea.  However she has lost 5 pounds but she attributes this to her restrictive diet  Past Medical History:  Diagnosis Date   Allergy    seasonal   Arthritis    Hyperlipidemia    Past Surgical History:  Procedure Laterality Date   TUBAL LIGATION  1986   Current Outpatient Medications on File Prior to Visit  Medication Sig Dispense Refill   Ascorbic Acid (VITAMIN C) 100 MG tablet Take 500 mg by mouth daily.     cetirizine  (ZYRTEC ) 10 MG tablet Take 1 tablet (10 mg total) by mouth daily. 30 tablet 3   cholecalciferol (VITAMIN D3) 25 MCG (1000 UT) tablet Take 1,000 Units by mouth daily.     clobetasol  cream (TEMOVATE ) 0.05 % Apply 1 application topically 2 (two) times daily. 30 g 2   diphenhydrAMINE HCl (ALLERGY MED PO) Take by mouth. (Patient not taking: Reported on 11/30/2021)     fluticasone  (FLONASE ) 50 MCG/ACT nasal spray Place 2 sprays into both nostrils daily. 9.9 mL 1   nitrofurantoin  (MACRODANTIN ) 50 MG capsule Take 1 capsule  (50 mg total) by mouth daily as needed. 30 capsule 3   No current facility-administered medications on file prior to visit.   Allergies  Allergen Reactions   Shellfish Allergy Hives   Social History   Socioeconomic History   Marital status: Married    Spouse name: Not on file   Number of children: Not on file   Years of education: Not on file   Highest education level: Bachelor's degree (e.g., BA, AB, BS)  Occupational History   Not on file  Tobacco Use   Smoking status: Never   Smokeless tobacco: Never  Substance and Sexual Activity   Alcohol use: Yes    Comment: Wine - occasional   Drug use: No   Sexual activity: Yes    Comment: married.  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  Other Topics Concern   Not on file  Social History Narrative   Not on file   Social Drivers of Health   Financial Resource Strain: Low Risk  (11/21/2023)   Overall Financial Resource Strain (CARDIA)    Difficulty of Paying Living Expenses: Not hard at all  Food Insecurity: No Food Insecurity (11/21/2023)   Hunger Vital Sign    Worried About Running Out of Food in the Last Year: Never true  Ran Out of Food in the Last Year: Never true  Transportation Needs: No Transportation Needs (11/21/2023)   PRAPARE - Administrator, Civil Service (Medical): No    Lack of Transportation (Non-Medical): No  Physical Activity: Insufficiently Active (11/21/2023)   Exercise Vital Sign    Days of Exercise per Week: 3 days    Minutes of Exercise per Session: 40 min  Stress: No Stress Concern Present (11/21/2023)   [employer] of Occupational Health - Occupational Stress Questionnaire    Feeling of Stress: Not at all  Social Connections: Unknown (11/21/2023)   Social Connection and Isolation Panel    Frequency of Communication with Friends and Family: Once a week    Frequency of Social Gatherings with Friends and Family: Patient declined    Attends Religious Services: More than 4 times per year     Active Member of [employer] or Organizations: No    Attends Engineer, structural: Not on file    Marital Status: Married  Catering manager Violence: Not on file      Review of Systems  All other systems reviewed and are negative.      Objective:   Physical Exam Vitals reviewed.  Constitutional:      General: She is not in acute distress.    Appearance: Normal appearance. She is well-developed and normal weight. She is not ill-appearing, toxic-appearing or diaphoretic.  Cardiovascular:     Rate and Rhythm: Normal rate and regular rhythm.     Heart sounds: Normal heart sounds. No murmur heard. Pulmonary:     Effort: Pulmonary effort is normal. No respiratory distress.     Breath sounds: Normal breath sounds. No wheezing or rales.  Abdominal:     General: Abdomen is flat. Bowel sounds are normal. There is no distension.     Palpations: Abdomen is soft. There is no shifting dullness, hepatomegaly, splenomegaly or mass.     Tenderness: There is no abdominal tenderness. There is no right CVA tenderness, left CVA tenderness, guarding or rebound. Negative signs include Murphy's sign and McBurney's sign.  Musculoskeletal:     Cervical back: Neck supple.  Neurological:     Mental Status: She is alert.           Assessment & Plan:  Generalized abdominal pain - Plan: CBC with Differential/Platelet, Comprehensive metabolic panel with GFR, Lipase Patient is overdue for colon cancer screening regardless because of her age.  Therefore recommend GI consultation for colonoscopy.  I suspect patient be dealing small bowel bacterial overgrowth due to the bloating and gas versus IBS with pain.  However she is not having diarrhea and constipation.  We are going to start by treating her with metronidazole  500 mg 3 times daily for 10 days to see if the bloating and gas improve.  If they do, no further workup may be necessary.  If they do not improve especially if the patient continues to have  intestinal spasms and pain, I recommended adding Linzess  145 mcg daily for the pain even though the patient is not having constipation.  Meanwhile check CBC, CMP, and lipase

## 2023-11-24 ENCOUNTER — Ambulatory Visit: Payer: Self-pay | Admitting: Family Medicine

## 2023-11-24 LAB — COMPREHENSIVE METABOLIC PANEL WITH GFR
AG Ratio: 1.9 (calc) (ref 1.0–2.5)
ALT: 11 U/L (ref 6–29)
AST: 15 U/L (ref 10–35)
Albumin: 4.4 g/dL (ref 3.6–5.1)
Alkaline phosphatase (APISO): 87 U/L (ref 37–153)
BUN: 8 mg/dL (ref 7–25)
CO2: 30 mmol/L (ref 20–32)
Calcium: 9.7 mg/dL (ref 8.6–10.4)
Chloride: 103 mmol/L (ref 98–110)
Creat: 0.78 mg/dL (ref 0.60–1.00)
Globulin: 2.3 g/dL (ref 1.9–3.7)
Glucose, Bld: 119 mg/dL — ABNORMAL HIGH (ref 65–99)
Potassium: 4 mmol/L (ref 3.5–5.3)
Sodium: 141 mmol/L (ref 135–146)
Total Bilirubin: 0.6 mg/dL (ref 0.2–1.2)
Total Protein: 6.7 g/dL (ref 6.1–8.1)
eGFR: 79 mL/min/1.73m2 (ref >=60)

## 2023-11-24 LAB — CBC WITH DIFFERENTIAL/PLATELET
Absolute Lymphocytes: 2061 {cells}/uL (ref 850–3900)
Absolute Monocytes: 559 {cells}/uL (ref 200–950)
Basophils Absolute: 33 {cells}/uL (ref 0–200)
Basophils Relative: 0.5 %
Eosinophils Absolute: 150 {cells}/uL (ref 15–500)
Eosinophils Relative: 2.3 %
HCT: 41.1 % (ref 35.0–45.0)
Hemoglobin: 13.2 g/dL (ref 11.7–15.5)
MCH: 28.8 pg (ref 27.0–33.0)
MCHC: 32.1 g/dL (ref 32.0–36.0)
MCV: 89.5 fL (ref 80.0–100.0)
MPV: 9.9 fL (ref 7.5–12.5)
Monocytes Relative: 8.6 %
Neutro Abs: 3699 {cells}/uL (ref 1500–7800)
Neutrophils Relative %: 56.9 %
Platelets: 256 Thousand/uL (ref 140–400)
RBC: 4.59 Million/uL (ref 3.80–5.10)
RDW: 13 % (ref 11.0–15.0)
Total Lymphocyte: 31.7 %
WBC: 6.5 Thousand/uL (ref 3.8–10.8)

## 2023-11-24 LAB — LIPASE: Lipase: 18 U/L (ref 7–60)

## 2023-11-27 ENCOUNTER — Ambulatory Visit: Admitting: Family Medicine

## 2023-12-14 ENCOUNTER — Telehealth: Payer: Self-pay | Admitting: Family Medicine

## 2023-12-14 ENCOUNTER — Other Ambulatory Visit: Payer: Self-pay | Admitting: Family Medicine

## 2023-12-14 MED ORDER — NYSTATIN 100000 UNIT/ML MT SUSP: 5.0000 mL | Freq: Four times a day (QID) | OROMUCOSAL | 0 refills | Status: AC

## 2023-12-14 NOTE — Telephone Encounter (Signed)
 Copied from CRM [id number]. Topic: General - Other >> Dec 07, 2023  2:39 PM Mia F wrote: Reason for CRM: [facility] says they received a referral for pt. They need office notes, labs, and any xrays or MRI's, CT's that may have been done. Pt cannot be schedued without this info. Please send to [phone] ATTN to Janie

## 2024-01-31 DIAGNOSIS — R140 Abdominal distension (gaseous): Secondary | ICD-10-CM | POA: Diagnosis not present

## 2024-01-31 DIAGNOSIS — Z1211 Encounter for screening for malignant neoplasm of colon: Secondary | ICD-10-CM | POA: Diagnosis not present

## 2024-01-31 DIAGNOSIS — R194 Change in bowel habit: Secondary | ICD-10-CM | POA: Diagnosis not present

## 2024-03-11 ENCOUNTER — Telehealth: Payer: Self-pay

## 2024-03-11 DIAGNOSIS — N3090 Cystitis, unspecified without hematuria: Secondary | ICD-10-CM

## 2024-03-11 NOTE — Telephone Encounter (Signed)
 Prescription Request  03/11/2024  LOV: 06/30/23 What is the name of the medication or equipment? nitrofurantoin  (MACRODANTIN ) 50 MG capsule [[id number]]   Have you contacted your pharmacy to request a refill? Yes   Which pharmacy would you like this sent to?  [id number] [address], [place] - [address] [place] [address] Phone: [phone] Fax: [phone]    Patient notified that their request is being sent to the clinical staff for review and that they should receive a response within 2 business days.   Please advise at [facility]

## 2024-03-12 NOTE — Telephone Encounter (Signed)
 Patient called to ask if she's having symptoms. She says no that this medication is used as a preventative and the Rx ran out, so she will need a refill. Advised I will send this to Dr. Duanne.

## 2024-03-12 NOTE — Telephone Encounter (Signed)
 Requested medication (s) are due for refill today: Yes  Requested medication (s) are on the active medication list: Yes  Last refill:  08/15/22  Future visit scheduled: Yes  Notes to clinic:  Unable to refill due to no refill protocol for this medication.      Requested Prescriptions  Pending Prescriptions Disp Refills   nitrofurantoin  (MACRODANTIN ) 50 MG capsule 30 capsule 3    Sig: Take 1 capsule (50 mg total) by mouth daily as needed.     Off-Protocol Failed - 03/12/2024  5:13 PM      Failed - Medication not assigned to a protocol, review manually.      Passed - Valid encounter within last 12 months    Recent Outpatient Visits           3 months ago Generalized abdominal pain   [facility] Pickard, Butler DASEN, MD   2 years ago Left wrist pain   [facility] Pickard, Butler DASEN, MD

## 2024-03-14 ENCOUNTER — Other Ambulatory Visit: Payer: Self-pay

## 2024-03-14 ENCOUNTER — Telehealth: Payer: Self-pay

## 2024-03-14 ENCOUNTER — Encounter: Payer: Self-pay | Admitting: Family Medicine

## 2024-03-14 DIAGNOSIS — N3090 Cystitis, unspecified without hematuria: Secondary | ICD-10-CM

## 2024-03-14 MED ORDER — NITROFURANTOIN MACROCRYSTAL 50 MG PO CAPS: 50.0000 mg | ORAL_CAPSULE | Freq: Every day | ORAL | 3 refills | Status: AC | PRN

## 2024-03-14 NOTE — Telephone Encounter (Signed)
 Prescription Request  03/14/2024  LOV: 11/27/23  What is the name of the medication or equipment? nitrofurantoin  (MACRODANTIN ) 50 MG capsule [[id number]]   Have you contacted your pharmacy to request a refill? Yes   Which pharmacy would you like this sent to?  [id number] [address], [place] - [address] [place] [address] Phone: [phone] Fax: [phone]    Patient notified that their request is being sent to the clinical staff for review and that they should receive a response within 2 business days.   Please advise at [facility]

## 2024-03-14 NOTE — Telephone Encounter (Signed)
 Already requested on 03/11/24 in a separate refill encounter, routed to the office, pending approval.
